# Patient Record
Sex: Female | Born: 1950 | Race: Black or African American | Hispanic: No | Smoking: Never smoker
Health system: Southern US, Community
[De-identification: ages and names within clinical notes are randomized; demographics above are authoritative.]

## PROBLEM LIST (undated history)

## (undated) DIAGNOSIS — R413 Other amnesia: Secondary | ICD-10-CM

## (undated) DIAGNOSIS — D649 Anemia, unspecified: Secondary | ICD-10-CM

## (undated) DIAGNOSIS — Z853 Personal history of malignant neoplasm of breast: Secondary | ICD-10-CM

## (undated) DIAGNOSIS — E785 Hyperlipidemia, unspecified: Secondary | ICD-10-CM

## (undated) DIAGNOSIS — Z95828 Presence of other vascular implants and grafts: Secondary | ICD-10-CM

## (undated) DIAGNOSIS — R0609 Other forms of dyspnea: Secondary | ICD-10-CM

## (undated) DIAGNOSIS — G459 Transient cerebral ischemic attack, unspecified: Secondary | ICD-10-CM

## (undated) DIAGNOSIS — I2699 Other pulmonary embolism without acute cor pulmonale: Secondary | ICD-10-CM

## (undated) DIAGNOSIS — K449 Diaphragmatic hernia without obstruction or gangrene: Secondary | ICD-10-CM

## (undated) DIAGNOSIS — R42 Dizziness and giddiness: Secondary | ICD-10-CM

## (undated) DIAGNOSIS — I82409 Acute embolism and thrombosis of unspecified deep veins of unspecified lower extremity: Secondary | ICD-10-CM

## (undated) DIAGNOSIS — I639 Cerebral infarction, unspecified: Secondary | ICD-10-CM

## (undated) DIAGNOSIS — I10 Essential (primary) hypertension: Secondary | ICD-10-CM

## (undated) DIAGNOSIS — I5030 Unspecified diastolic (congestive) heart failure: Secondary | ICD-10-CM

## (undated) DIAGNOSIS — M1990 Unspecified osteoarthritis, unspecified site: Secondary | ICD-10-CM

## (undated) DIAGNOSIS — M47812 Spondylosis without myelopathy or radiculopathy, cervical region: Secondary | ICD-10-CM

## (undated) DIAGNOSIS — C50919 Malignant neoplasm of unspecified site of unspecified female breast: Secondary | ICD-10-CM

## (undated) DIAGNOSIS — G4700 Insomnia, unspecified: Secondary | ICD-10-CM

## (undated) DIAGNOSIS — K219 Gastro-esophageal reflux disease without esophagitis: Secondary | ICD-10-CM

## (undated) DIAGNOSIS — F32A Depression, unspecified: Secondary | ICD-10-CM

## (undated) DIAGNOSIS — F419 Anxiety disorder, unspecified: Secondary | ICD-10-CM

## (undated) DIAGNOSIS — Z7901 Long term (current) use of anticoagulants: Secondary | ICD-10-CM

## (undated) DIAGNOSIS — I2510 Atherosclerotic heart disease of native coronary artery without angina pectoris: Secondary | ICD-10-CM

## (undated) DIAGNOSIS — C801 Malignant (primary) neoplasm, unspecified: Secondary | ICD-10-CM

## (undated) DIAGNOSIS — Z550 Illiteracy and low-level literacy: Secondary | ICD-10-CM

## (undated) DIAGNOSIS — M5136 Other intervertebral disc degeneration, lumbar region: Secondary | ICD-10-CM

## (undated) DIAGNOSIS — M51369 Other intervertebral disc degeneration, lumbar region without mention of lumbar back pain or lower extremity pain: Secondary | ICD-10-CM

## (undated) DIAGNOSIS — R519 Headache, unspecified: Secondary | ICD-10-CM

## (undated) DIAGNOSIS — R0600 Dyspnea, unspecified: Secondary | ICD-10-CM

## (undated) DIAGNOSIS — R011 Cardiac murmur, unspecified: Secondary | ICD-10-CM

## (undated) DIAGNOSIS — R002 Palpitations: Secondary | ICD-10-CM

## (undated) HISTORY — DX: Palpitations: R00.2

## (undated) HISTORY — DX: Insomnia, unspecified: G47.00

## (undated) HISTORY — DX: Unspecified diastolic (congestive) heart failure: I50.30

## (undated) HISTORY — DX: Transient cerebral ischemic attack, unspecified: G45.9

## (undated) HISTORY — DX: Cardiac murmur, unspecified: R01.1

## (undated) HISTORY — DX: Anxiety disorder, unspecified: F41.9

## (undated) HISTORY — DX: Long term (current) use of anticoagulants: Z79.01

## (undated) HISTORY — DX: Illiteracy and low-level literacy: Z55.0

## (undated) HISTORY — DX: Other intervertebral disc degeneration, lumbar region without mention of lumbar back pain or lower extremity pain: M51.369

## (undated) HISTORY — DX: Acute embolism and thrombosis of unspecified deep veins of unspecified lower extremity: I82.409

## (undated) HISTORY — PX: TOTAL HIP ARTHROPLASTY: SHX124

## (undated) HISTORY — DX: Other amnesia: R41.3

## (undated) HISTORY — DX: Spondylosis without myelopathy or radiculopathy, cervical region: M47.812

## (undated) HISTORY — PX: PORTA CATH INSERTION: CATH118285

## (undated) HISTORY — DX: Cerebral infarction, unspecified: I63.9

## (undated) HISTORY — PX: TOTAL KNEE ARTHROPLASTY: SHX125

## (undated) HISTORY — DX: Personal history of malignant neoplasm of breast: Z85.3

## (undated) HISTORY — DX: Other intervertebral disc degeneration, lumbar region: M51.36

## (undated) HISTORY — PX: ABDOMINAL HYSTERECTOMY: SHX81

---

## 2005-06-02 ENCOUNTER — Encounter: Payer: Self-pay | Admitting: Orthopedic Surgery

## 2005-06-09 ENCOUNTER — Encounter: Payer: Self-pay | Admitting: Orthopedic Surgery

## 2005-07-09 ENCOUNTER — Encounter: Payer: Self-pay | Admitting: Orthopedic Surgery

## 2008-03-08 ENCOUNTER — Emergency Department (HOSPITAL_COMMUNITY): Admission: EM | Admit: 2008-03-08 | Discharge: 2008-03-08 | Payer: Self-pay | Admitting: Emergency Medicine

## 2008-07-21 ENCOUNTER — Observation Stay (HOSPITAL_COMMUNITY): Admission: EM | Admit: 2008-07-21 | Discharge: 2008-07-23 | Payer: Self-pay | Admitting: Emergency Medicine

## 2008-07-21 ENCOUNTER — Ambulatory Visit: Payer: Self-pay | Admitting: Cardiology

## 2008-07-22 ENCOUNTER — Encounter: Payer: Self-pay | Admitting: Cardiology

## 2008-07-23 ENCOUNTER — Emergency Department (HOSPITAL_COMMUNITY): Admission: EM | Admit: 2008-07-23 | Discharge: 2008-07-23 | Payer: Self-pay | Admitting: Emergency Medicine

## 2009-11-24 ENCOUNTER — Emergency Department (HOSPITAL_COMMUNITY): Admission: EM | Admit: 2009-11-24 | Discharge: 2009-11-24 | Payer: Self-pay | Admitting: Emergency Medicine

## 2010-04-03 ENCOUNTER — Observation Stay (HOSPITAL_COMMUNITY): Admission: EM | Admit: 2010-04-03 | Discharge: 2010-04-04 | Payer: Self-pay | Admitting: Emergency Medicine

## 2010-12-25 LAB — CBC
HCT: 36.7 % (ref 36.0–46.0)
MCH: 25.1 pg — ABNORMAL LOW (ref 26.0–34.0)
MCHC: 32.8 g/dL (ref 30.0–36.0)
MCHC: 33 g/dL (ref 30.0–36.0)
MCV: 76.2 fL — ABNORMAL LOW (ref 78.0–100.0)
Platelets: 272 10*3/uL (ref 150–400)
Platelets: 294 10*3/uL (ref 150–400)
RBC: 4.79 MIL/uL (ref 3.87–5.11)
RDW: 14.9 % (ref 11.5–15.5)
WBC: 6.5 10*3/uL (ref 4.0–10.5)
WBC: 8.4 10*3/uL (ref 4.0–10.5)

## 2010-12-25 LAB — URINALYSIS, ROUTINE W REFLEX MICROSCOPIC
Glucose, UA: NEGATIVE mg/dL
Protein, ur: NEGATIVE mg/dL
Urobilinogen, UA: 0.2 mg/dL (ref 0.0–1.0)

## 2010-12-25 LAB — BASIC METABOLIC PANEL
BUN: 10 mg/dL (ref 6–23)
CO2: 25 mEq/L (ref 19–32)
Calcium: 9 mg/dL (ref 8.4–10.5)
Chloride: 106 mEq/L (ref 96–112)
GFR calc non Af Amer: >60 mL/min (ref >60)
Glucose, Bld: 109 mg/dL — ABNORMAL HIGH (ref 70–99)
Potassium: 3.7 mEq/L (ref 3.5–5.1)
Sodium: 139 mEq/L (ref 135–145)

## 2010-12-25 LAB — POCT CARDIAC MARKERS
CKMB, poc: 1.5 ng/mL (ref 1.0–8.0)
Troponin i, poc: <0.05 ng/mL (ref 0.00–0.09)

## 2010-12-25 LAB — DIFFERENTIAL
Basophils Absolute: 0 10*3/uL (ref 0.0–0.1)
Basophils Relative: 1 % (ref 0–1)
Eosinophils Absolute: 0.1 10*3/uL (ref 0.0–0.7)
Eosinophils Relative: 2 % (ref 0–5)
Lymphocytes Relative: 29 % (ref 12–46)
Lymphs Abs: 2.7 10*3/uL (ref 0.7–4.0)
Monocytes Absolute: 0.6 10*3/uL (ref 0.1–1.0)
Monocytes Relative: 10 % (ref 3–12)
Neutro Abs: 3.8 10*3/uL (ref 1.7–7.7)
Neutrophils Relative %: 56 % (ref 43–77)

## 2010-12-25 LAB — CARDIAC PANEL(CRET KIN+CKTOT+MB+TROPI)
CK, MB: 1.3 ng/mL (ref 0.3–4.0)
Relative Index: 0.9 (ref 0.0–2.5)
Relative Index: 1.2 (ref 0.0–2.5)
Total CK: 142 U/L (ref 7–177)
Troponin I: 0.01 ng/mL (ref 0.00–0.06)
Troponin I: 0.02 ng/mL (ref 0.00–0.06)

## 2010-12-25 LAB — URINE MICROSCOPIC-ADD ON

## 2011-02-21 NOTE — Consult Note (Signed)
NAME:  Margaret Avila, Margaret Avila NO.:  [id number]   MEDICAL RECORD NO.:  [id number]          PATIENT TYPE:  INP   LOCATION:  A321                          FACILITY:  [facility]   PHYSICIAN:  Jonelle Sidle, MD DATE OF BIRTH:  10/11/1950   DATE OF CONSULTATION:  DATE OF DISCHARGE:                                 CONSULTATION   REFERRING PHYSICIAN:  [facility].   PRIMARY CARE PHYSICIAN:  Dr. Jorene Guest with [facility].   REASON FOR CONSULTATION:  Chest pain.   HISTORY OF PRESENT ILLNESS:  Margaret Avila is a pleasant 60 year old  woman with a history of hypertension at least over the last 3-4 years by  her report but no personal history of cardiovascular disease or  myocardial infarction.  She has had problems with gastroesophageal  reflux disease and has undergone prior endoscopy, presently being  treated with a proton-pump-inhibitor.  She is now admitted to the  hospital with a fairly longstanding intermittent history of sharp  stabbing chest pain that is very sporadic and not reproduced with  exertion.  Symptoms can last for several hours at a time and there is no  specific alleviating factor noted.  Margaret Avila states that these  symptoms have been going on for the last year although has been  progressing in both frequency and intensity.  She states that she saw  Dr. Willene Hatchet with [facility] at the [facility] (he is a cardiologist from [facility]) back on  October 8.  There are no details available at this time, but she tells  me that other testing was planned.   Margaret Avila says that she may have had some type of stress testing  approximately 2 years ago in [place], [place] and thinks that these  results may have been reassuring.  At the present time, she denies any  active chest pain and her cardiac markers have actually been fairly  reassuring.  Peak troponin-I level at this point is 0.02 and  her peak CK-  MB is 1.6.  BNP level is less than 30.  Electrocardiogram demonstrates  sinus rhythm with decreased R-wave progression, but no frank  anteroseptal Q-waves and nonspecific T-wave changes noted.  No old  tracings are available for comparison at this point.  Her chest x-ray  shows cardiomegaly with no acute abnormalities, specifically no  effusions or infiltrates and no pneumothorax.   ALLERGIES:  No known drug allergies.   MEDICATIONS AT HOME:  1. Amlodipine 10 mg p.o. daily.  2. Carvedilol 25 mg p.o. b.i.d.  3. Omeprazole 20 mg p.o. daily.  4. Meclizine 12.5 mg p.o. t.i.d.  5. Clonidine 0.1 mg p.o. b.i.d.   At the present time she is also being treated with:  1. Aspirin 81 mg p.o. daily.  2. Heparin infusion.   PAST MEDICAL HISTORY:  As detailed above.  There may also a history of  hyperlipidemia being managed conservatively based on limited  information.  She also has a history of vertigo.   PAST SURGICAL HISTORY:  1. Hysterectomy.  2. Previous  knee surgery.   SOCIAL HISTORY:  Margaret Avila is unemployed at this time.  She denies  any history of tobacco, alcohol or illicit substance use.  She lives in  [place] and states that she typically functions independently.   FAMILY HISTORY:  Significant for high blood pressure and some type of  aneurysm.  There is no clear history of premature cardiovascular  disease.  She does state her father and mother both had heart problems,  but that they died at a relatively advanced age.   REVIEW OF SYSTEMS:  No fevers or chills.  No cough, hemoptysis, melena,  hematochezia, palpitations, orthopnea, PND, lower extremity edema.  She  denies any significant claudication symptoms.  She does have reflux.  Some general weakness.  Otherwise, negative.   PHYSICAL EXAMINATION:  Temperature is 98.4 degrees, heart rate is 76 in  sinus rhythm, respirations 20, blood pressure 150/82, oxygen saturation  91% on 2 L nasal cannula,  previously 100%, weight is 84.7 kg, height 63  inches.  This is an overweight woman lying in bed in no acute distress, denying  any active chest pain or breathlessness.  HEENT:  Conjunctivae is normal.  Oropharynx is clear.  Poor dentition.  NECK:  Supple.  No elevated jugular venous pressure.  No loud carotid  bruits or thyromegaly.  LUNGS:  Clear diminished breath sounds, but nonlabored breathing.  CARDIAC:  Exam reveals a regular rate and rhythm.  No significant  systolic murmur or pericardial rub.  No obvious S3 gallop.  Heart sounds  somewhat distant.  ABDOMEN:  Soft, nontender.  No obvious hepatomegaly.  No  guarding.  Bowel sounds are present.  EXTREMITIES:  Exhibit no frank pitting edema.  Distal pulses are 1-2+.  SKIN:  Warm and dry.  MUSCULOSKELETAL:  No kyphosis noted.  NEUROPSYCHIATRIC:  The patient is alert x3.  Affect is somewhat flat   LABORATORY DATA:  WBC 6.9, hemoglobin 12.8, hematocrit 38.2, platelets  275, INR 1.1.  Sodium 139, potassium 4.1, chloride 105, bicarb 27,  glucose 95, BUN 16, creatinine 0.74 lipase 21, alkaline phosphatase 105,  AST 15, ALT 15, albumin 3.7.   IMPRESSION:  1. Chest pain syndrome with fairly atypical features.  Symptoms have      been intermittent yet progressive and in sporadic fashion over the      last year.  At this point, resting electrocardiogram is rather      nonspecific with decreased anterior R-wave progression, but no      frank Q-waves.  Cardiac markers are reassuring.  She has had      assessment by Dr. Jaymes Graff just recently with plans for some type      of additional testing, I presume further risk stratification      although no details are available at this time.  She is presently      symptom free and hemodynamically stable.  2. History of hypertension.  3. Possible history of hyperlipidemia.  4. Gastroesophageal reflux disease with previous endoscopy and      treatment with proton- pump-inhibitor therapy.    RECOMMENDATIONS:  I discussed the situation with the patient.  At this  point, we will plan to obtain records from her recent evaluation by Dr.  Jaymes Graff.  Would continue to cycle full set of cardiac markers and we  will arrange a 2-D echocardiogram as well to exclude any obvious focal  wall motion abnormalities that might help guide further ischemic  testing.  Tentatively, an inpatient adenosine Myoview  will be scheduled  for tomorrow morning (the patient has already eaten today) unless  decision is made to proceed with further invasive testing based on  review of records and her echocardiogram.  Her symptoms are fairly  atypical and at this point our plan will be a noninvasive assessment.  We will continue present medical therapy and we will follow up on her  later.      Jonelle Sidle, MD  Electronically Signed     SGM/MEDQ  D:  07/22/2008  T:  07/22/2008  Job:  [id number]   cc:   Jorene Guest, Dr.  [facility]   [facility]

## 2011-02-21 NOTE — Group Therapy Note (Signed)
Margaret Avila, Margaret Avila              ACCOUNT NO.:  [id number]   MEDICAL RECORD NO.:  [id number]          PATIENT TYPE:  INP   LOCATION:  A321                          FACILITY:  [facility]   PHYSICIAN:  Dorris Singh, DO    DATE OF BIRTH:  21-Dec-1950   DATE OF PROCEDURE:  07/22/2008  DATE OF DISCHARGE:                                 PROGRESS NOTE   The patient is seen today, has denied any chest pain.  She just finished  an echo.  States she feels pretty good and has not had any repeat  episode since admission.  She has been seen by cardiology and awaits  further instructions regarding discharge planning or additional testing.   VITAL SIGNS:  Temperature is 98.4, pulse 76, respirations 20, blood  pressure 150/82.  GENERAL:  The patient is an [place] American female who is well-  developed, well-nourished, in no acute distress.  HEART:  Regular rate and rhythm.  LUNGS:  Clear to auscultation bilaterally.  ABDOMEN:  Soft, nontender, nondistended.  EXTREMITIES:  Positive pulses, no ecchymosis, edema or cyanosis.   She has no labs for today except for cardiac markers which are all  within normal limits.   ASSESSMENT AND PLAN:  1. Chest pain.  Echo was done.  We have gotten [facility] on      the case and they will do an inpatient adenosine Myoview scheduled      for tomorrow and will continue to monitor her throughout the night.  2. Hypertension.  Continue her antihypertensives.  3. Vertigo.  She is on meclizine.  Will continue with that.  4. History of dyslipidemia.  Will continue her with her current      medication regimen.      Dorris Singh, DO  Electronically Signed     CB/MEDQ  D:  07/22/2008  T:  07/22/2008  Job:  [id number]

## 2011-02-21 NOTE — H&P (Signed)
NAMECORTANA, VANDERFORD NO.:  [id number]   MEDICAL RECORD NO.:  [id number]          PATIENT TYPE:  EMS   LOCATION:  ED                            FACILITY:  [facility]   PHYSICIAN:  Osvaldo Shipper, MD     DATE OF BIRTH:  11/15/1950   DATE OF ADMISSION:  07/21/2008  DATE OF DISCHARGE:  [facility]                              HISTORY & PHYSICAL   PRIMARY CARE PHYSICIAN:  Dr. Jorene Guest with [facility].   ADMITTING DIAGNOSES:  1. Chest pain, rule out acute coronary syndrome.  2. Hypertension.  3. Obesity.  4. History of vertigo.   CHIEF COMPLAINT:  Chest pain since yesterday morning.   HISTORY OF PRESENT ILLNESS:  The patient is a 60 year old African  American female who it appears has had longstanding history of chest  pain.  Apparently she underwent an EGD for this about a year ago done in  [place].  She was started on omeprazole after that.  She apparently has  been having chest pain for the past few months and has been referred to  Dr. Mikki Harbor from [facility] in [place].  She had an  appointment with him on October 8 and they were supposed to call her  back for further testing in their office but she has not received that  call yet.  The patient was in her usual state of health until yesterday  morning when she was sitting and suddenly experienced retrosternal chest  pain.  The pain was described as a pressure like sensation, stabbing  pains which persisted throughout yesterday, through the night and this  morning.  She experienced shortness of breath.  She experienced an  episode of emesis on Sunday night.  She denies any palpitations, no  diaphoresis, no aggravating or alleviating factors identified though she  does state that the pain appears to be worse when she is lying down than  sitting up.  No fever or chills.  No cough.  No sick contacts.  No long  road trips or any other travel outside this area.  The pain initially  was 8/10 in  intensity and eventually was given nitroglycerin and it came  down to 0/10 in intensity.   Please note that it is unclear after history was obtained exactly when  the pain started because the first time she told me it was Monday  morning and the other time she told me it was Sunday evening so history  is not of good quality from this patient.   HOME MEDICATIONS:  1. Amlodipine 10 mg once a day.  2. Carvedilol 25 mg twice a day.  3. Omeprazole 40 mg once a day.  4. Meclizine 12.5 mg 3 times a day.  5. Clonidine 0.1 mg twice a day.   ALLERGIES:  No known drug allergies.   PAST MEDICAL HISTORY:  Consists of hypertension, obesity, GERD, says  dyslipidemia but she is not on any medications.  She also has history of  vertigo for which she is on meclizine.   PAST SURGICAL HISTORY:  Includes hysterectomy, knee surgery and  she has  had that EGD about a year ago.   SOCIAL HISTORY:  Lives in [place].  Unemployed.  No smoking,  alcohol or illicit drug use.  Independent for daily activities.   FAMILY HISTORY:  Positive for hypertension and aneurysm of unknown type.  No history of coronary artery disease that the patient is aware of.   REVIEW OF SYSTEMS:  GENERAL:  Positive for weakness, malaise.  HEENT:  Normocephalic.  CARDIOVASCULAR:  As in HPI.  RESPIRATORY:  As in HPI.  GI:  Unremarkable.  GU:  Unremarkable.  NEUROLOGICAL:  Unremarkable.  PSYCHIATRIC:  Unremarkable.  MUSCULOSKELETAL:  Unremarkable.   PHYSICAL EXAMINATION:  VITAL SIGNS:  Temperature 98.2, blood pressure  143/75, heart rate 102, regular.  Respiratory rate 16, saturation 100%  on room air.  GENERAL:  An obese black female in no distress.  HEENT:  There is no pallor, no icterus.  Oral mucosa membranes are  moist.  No oral lesions are noted.  NECK:  Soft and supple. No thyromegaly is appreciated.  LUNGS:  Clear to auscultation bilaterally.  No wheezing, rales or  rhonchi.  CARDIOVASCULAR:  S1, S2 normal.   Regular.  No murmurs appreciated.  No  S3, S4, no rubs, no bruits.  No JVD is noted.  ABDOMEN:  Obese, nontender, nondistended.  Bowel sounds are present.  No  masses or organomegaly is appreciated.  EXTREMITIES:  Do not show any edema.  MUSCULOSKELETAL:  Otherwise unremarkable.  NEUROLOGICAL:  She is alert and oriented x3.  No focal neurological  deficits are present.   LAB DATA:  CBC is unremarkable.  PT, INR and PTT normal.  BMET is  normal.  Cardiac markers are normal.   She had a chest x-ray which did not show any acute cardiopulmonary  process.  Cardiomegaly however was noticed.  EKG shows a sinus rhythm  with normal axis.  Intervals appeared to be in the normal range.  No Q  waves are identified.  No concerning ST or T wave changes are identified  on this EKG.  No old EKGs available to compare.   ASSESSMENT:  This is a 60 year old black female who presents with chest  pain.  She has risk factors in the form of hypertension, obesity.  The  pain has been ongoing for at least a few months on and off.  She has  seen a cardiologist from what appears to be [facility] cardiology department  but has not undergone any testing yet.  Differential diagnosis include  coronary artery disease, esophageal spasms, GERD.  PE is less likely  considering the duration of the pain.   PLAN:  1. Chest pain, admit her to the hospital and rule her out for acute      coronary syndrome.  She has been started on heparin and      nitroglycerin dose which will be continued.  We will repeat EKGs.      We will consult cardiology from [place] to help [place] evaluate this      patient further. LFTs and lipase will also be checked.  2. Hypertension.  Will continue with her current antihypertensive      regimen.  3. History of vertigo.  Will continue with meclizine.  4. History of dyslipidemia.  Will check her lipid profile and if she      requires will start her on a statin.   Further management/decisions will depend  on results of further testing  and the patient's response to treatment.  Osvaldo Shipper, MD  Electronically Signed     GK/MEDQ  D:  07/21/2008  T:  07/21/2008  Job:  [id number]   cc:   Gerrit Friends. Dietrich Pates, MD, [facility]  [address]  [place], [place] [address]

## 2011-02-24 NOTE — Discharge Summary (Signed)
Margaret Avila, Margaret Avila              ACCOUNT NO.:  [id number]   MEDICAL RECORD NO.:  [id number]          PATIENT TYPE:  OBV   LOCATION:  A321                          FACILITY:  [facility]   PHYSICIAN:  Dorris Singh, DO    DATE OF BIRTH:  1950-11-15   DATE OF ADMISSION:  07/21/2008  DATE OF DISCHARGE:  10/15/2009[facility]                               DISCHARGE SUMMARY   ADMISSION DIAGNOSES:  1. Chest pain.  Rule out acute coronary syndrome.  2. Hypertension.  3. Obesity.  4. History of vertigo.   DISCHARGE DIAGNOSES:  1. Chest pain, resolved.  2. History of hypertension.  3. History of obesity.  4. History of vertigo.   PRIMARY CARE PHYSICIAN:  Dr. Jorene Guest.   TESTING THAT SHE HAD DONE:  She had a portable chest x-ray on July 21, 2008 which showed cardiomegaly, no acute abnormalities, and then on  July 23, 2008 she had a Myoview stress test which showed negative  stress nuclear myocardial study revealing somewhat impaired exercise  capacity, normal left ventricular size, normal left systolic function,  and normal left myocardial perfusion.  Other findings as noted.  Her H  and P was done by Dr. Rito Ehrlich.  The patient was admitted.   HOSPITAL COURSE:  She was admitted with the above diagnoses and she was  admitted to the service of [facility].  [facility] was consulted.  Due to the patient's waxing and waning symptoms, they obtained her old  records from Dr. Jaymes Graff and they continued to monitor her cardiac  markers as well as a 2D echo, and her 2D echo demonstrated normal EF.  Aortic thickness was mildly increased.  Left atrium was mildly dilated.  Right ventricular size was at its upper limits of normal.  On July 22, 2008 it was determined that due to the patient's waxing and waning  symptoms that they would go ahead with a tentatively-scheduled inpatient  adenosine Myoview, which was scheduled for the next day, and if this was  normal, then she could be discharged  with followup with her primary care  physician and with [facility] if needed.  On July 23, 2008  she had this test as noted above.  It was determined via cardiology that  the patient could then be discharged.   Her medications that she was discharged on included amlodipine 10 mg  once a day, carvedilol 25 mg twice a day, omeprazole 40 mg once a day,  meclizine 12.5 mg 3 times a day p.r.n., and clonidine 0.1 mg twice a  day.  She was not placed on any new medications.  She was recommended to  follow up with Dr. Jorene Guest in the next 1-3 days, as  soon as the next business day as possible.  She was told to increase her  activity slowly and to continue with a heart-healthy diet.  She was also  told to return if symptoms worsen.  All questions were answered for the  patient and I spent 30 minutes preparing this report.      Dorris Singh, DO  Electronically Signed  CB/MEDQ  D:  07/25/2008  T:  07/25/2008  Job:  [id number]

## 2011-07-05 LAB — BASIC METABOLIC PANEL
BUN: 22
CO2: 28
Creatinine, Ser: 0.84
GFR calc non Af Amer: >60
Glucose, Bld: 104 — ABNORMAL HIGH
Potassium: 3.3 — ABNORMAL LOW

## 2011-07-10 LAB — TSH: TSH: 1.422

## 2011-07-10 LAB — DIFFERENTIAL
Basophils Absolute: 0
Basophils Relative: 0
Basophils Relative: 1
Eosinophils Absolute: 0.1
Eosinophils Absolute: 0.2
Eosinophils Relative: 2
Lymphocytes Relative: 30
Lymphs Abs: 2.1
Monocytes Absolute: 0.8
Monocytes Absolute: 0.9
Monocytes Relative: 12
Monocytes Relative: 13 — ABNORMAL HIGH
Neutro Abs: 3.8
Neutrophils Relative %: 41 — ABNORMAL LOW
Neutrophils Relative %: 56

## 2011-07-10 LAB — B-NATRIURETIC PEPTIDE (CONVERTED LAB): Pro B Natriuretic peptide (BNP): <30

## 2011-07-10 LAB — BASIC METABOLIC PANEL
BUN: 16
CO2: 25
Calcium: 9.5
Chloride: 105
Creatinine, Ser: 0.74
Creatinine, Ser: 0.77
GFR calc Af Amer: >60
GFR calc Af Amer: >60
Glucose, Bld: 95
Sodium: 139

## 2011-07-10 LAB — CBC
Hemoglobin: 12
MCHC: 32.8
MCV: 79.9
MCV: 80.6
RBC: 4.54
RBC: 4.78
WBC: 6.9

## 2011-07-10 LAB — HEPATIC FUNCTION PANEL
ALT: 15
AST: 15
Albumin: 3.7
Alkaline Phosphatase: 105
Total Bilirubin: 0.4

## 2011-07-10 LAB — LIPID PANEL
LDL Cholesterol: 175 — ABNORMAL HIGH
Total CHOL/HDL Ratio: 5.4
VLDL: 24

## 2011-07-10 LAB — CARDIAC PANEL(CRET KIN+CKTOT+MB+TROPI)
CK, MB: 1.3
Relative Index: INVALID
Relative Index: INVALID
Troponin I: 0.02

## 2011-07-10 LAB — PROTIME-INR
INR: 1.1
Prothrombin Time: 14.3

## 2011-07-10 LAB — POCT CARDIAC MARKERS
CKMB, poc: 1.8
Myoglobin, poc: 55.4
Troponin i, poc: <0.05

## 2011-07-10 LAB — HEPARIN LEVEL (UNFRACTIONATED): Heparin Unfractionated: 1.2 — ABNORMAL HIGH

## 2011-07-10 LAB — APTT: aPTT: 29

## 2012-08-18 ENCOUNTER — Encounter (HOSPITAL_COMMUNITY): Payer: Self-pay | Admitting: Emergency Medicine

## 2012-08-18 ENCOUNTER — Emergency Department (HOSPITAL_COMMUNITY)
Admission: EM | Admit: 2012-08-18 | Discharge: 2012-08-18 | Disposition: A | Discharge status: Home / Self Care | Payer: Medicaid Other | Attending: Emergency Medicine | Admitting: Emergency Medicine

## 2012-08-18 ENCOUNTER — Emergency Department (HOSPITAL_COMMUNITY): Payer: Medicaid Other

## 2012-08-18 DIAGNOSIS — I10 Essential (primary) hypertension: Secondary | ICD-10-CM | POA: Insufficient documentation

## 2012-08-18 DIAGNOSIS — E785 Hyperlipidemia, unspecified: Secondary | ICD-10-CM | POA: Insufficient documentation

## 2012-08-18 DIAGNOSIS — Z79899 Other long term (current) drug therapy: Secondary | ICD-10-CM | POA: Insufficient documentation

## 2012-08-18 DIAGNOSIS — R42 Dizziness and giddiness: Secondary | ICD-10-CM | POA: Insufficient documentation

## 2012-08-18 DIAGNOSIS — Z8719 Personal history of other diseases of the digestive system: Secondary | ICD-10-CM | POA: Insufficient documentation

## 2012-08-18 DIAGNOSIS — Z90710 Acquired absence of both cervix and uterus: Secondary | ICD-10-CM | POA: Insufficient documentation

## 2012-08-18 DIAGNOSIS — R109 Unspecified abdominal pain: Secondary | ICD-10-CM | POA: Insufficient documentation

## 2012-08-18 HISTORY — DX: Hyperlipidemia, unspecified: E78.5

## 2012-08-18 HISTORY — DX: Gastro-esophageal reflux disease without esophagitis: K21.9

## 2012-08-18 HISTORY — DX: Essential (primary) hypertension: I10

## 2012-08-18 HISTORY — DX: Diaphragmatic hernia without obstruction or gangrene: K44.9

## 2012-08-18 HISTORY — DX: Dizziness and giddiness: R42

## 2012-08-18 LAB — CBC WITH DIFFERENTIAL/PLATELET
Basophils Absolute: 0 10*3/uL (ref 0.0–0.1)
Eosinophils Absolute: 0.1 10*3/uL (ref 0.0–0.7)
Eosinophils Relative: 1 % (ref 0–5)
MCH: 25.5 pg — ABNORMAL LOW (ref 26.0–34.0)
MCHC: 32.1 g/dL (ref 30.0–36.0)
MCV: 79.4 fL (ref 78.0–100.0)
Platelets: 318 10*3/uL (ref 150–400)
RDW: 13.9 % (ref 11.5–15.5)

## 2012-08-18 LAB — URINALYSIS, ROUTINE W REFLEX MICROSCOPIC
Glucose, UA: NEGATIVE mg/dL
Nitrite: NEGATIVE
Protein, ur: NEGATIVE mg/dL

## 2012-08-18 LAB — TROPONIN I: Troponin I: <0.3 ng/mL (ref <0.30)

## 2012-08-18 LAB — COMPREHENSIVE METABOLIC PANEL
ALT: 14 U/L (ref 0–35)
AST: 18 U/L (ref 0–37)
Calcium: 9.9 mg/dL (ref 8.4–10.5)
GFR calc Af Amer: 68 mL/min — ABNORMAL LOW (ref >90)
Sodium: 140 mEq/L (ref 135–145)
Total Protein: 8.1 g/dL (ref 6.0–8.3)

## 2012-08-18 MED ORDER — GI COCKTAIL ~~LOC~~
30.0000 mL | Freq: Once | ORAL | Status: AC
  Administered 2012-08-18: 30 mL via ORAL
  Filled 2012-08-18: qty 30

## 2012-08-18 MED ORDER — POTASSIUM CHLORIDE 20 MEQ/15ML (10%) PO LIQD
40.0000 meq | Freq: Once | ORAL | Status: AC
  Administered 2012-08-18: 40 meq via ORAL
  Filled 2012-08-18: qty 30

## 2012-08-18 MED ORDER — MORPHINE SULFATE 4 MG/ML IJ SOLN
4.0000 mg | INTRAMUSCULAR | Status: DC | PRN
  Administered 2012-08-18: 4 mg via INTRAVENOUS
  Filled 2012-08-18 (×2): qty 1

## 2012-08-18 MED ORDER — FAMOTIDINE IN NACL 20-0.9 MG/50ML-% IV SOLN
20.0000 mg | Freq: Once | INTRAVENOUS | Status: AC
  Administered 2012-08-18: 20 mg via INTRAVENOUS
  Filled 2012-08-18: qty 50

## 2012-08-18 NOTE — ED Notes (Signed)
Pt is aware of a urine sample, pt states she will notify staff when she can void.  

## 2012-08-18 NOTE — ED Provider Notes (Signed)
History     CSN: [id number]  Arrival date & time 08/18/12  8295   First MD Initiated Contact with Patient 08/18/12 1008      Chief Complaint  Patient presents with  . Gastrophageal Reflux     HPI Pt was seen at 1020.  Per pt, c/o gradual onset and persistence of constant upper mid-abd/lower mid-sternal chest "pain" since yesterday morning after she woke up.  Pt describes the pain as constant, "burning," and per her usual chronic GERD symptoms.  Denies palpitations, no SOB/cough, no back pain, no N/V/D, no fevers.    Past Medical History  Diagnosis Date  . Hypertension   . Hyperlipidemia   . Acid reflux   . Vertigo   . Hiatal hernia     Past Surgical History  Procedure Date  . Total knee arthroplasty   . Total hip arthroplasty   . Abdominal hysterectomy     History  Substance Use Topics  . Smoking status: Never Smoker   . Smokeless tobacco: Not on file  . Alcohol Use: No      Review of Systems ROS: Statement: All systems negative except as marked or noted in the HPI; Constitutional: Negative for fever and chills. ; ; Eyes: Negative for eye pain, redness and discharge. ; ; ENMT: Negative for ear pain, hoarseness, nasal congestion, sinus pressure and sore throat. ; ; Cardiovascular: Negative for chest pain, palpitations, diaphoresis, dyspnea and peripheral edema. ; ; Respiratory: Negative for cough, wheezing and stridor. ; ; Gastrointestinal: +abd pain. Negative for nausea, vomiting, diarrhea, blood in stool, hematemesis, jaundice and rectal bleeding. . ; ; Genitourinary: Negative for dysuria, flank pain and hematuria. ; ; Musculoskeletal: Negative for back pain and neck pain. Negative for swelling and trauma.; ; Skin: Negative for pruritus, rash, abrasions, blisters, bruising and skin lesion.; ; Neuro: Negative for headache, lightheadedness and neck stiffness. Negative for weakness, altered level of consciousness , altered mental status, extremity weakness, paresthesias,  involuntary movement, seizure and syncope.        Allergies  Review of patient's allergies indicates no known allergies.  Home Medications   Current Outpatient Rx  Name  Route  Sig  Dispense  Refill  . AMLODIPINE BESYLATE 10 MG PO TABS   Oral   Take 10 mg by mouth daily.         Marland Kitchen HYDROCHLOROTHIAZIDE 25 MG PO TABS   Oral   Take 25 mg by mouth daily.         Marland Kitchen OMEPRAZOLE 20 MG PO CPDR   Oral   Take 20 mg by mouth daily.         Marland Kitchen PRAVASTATIN SODIUM 40 MG PO TABS   Oral   Take 40 mg by mouth daily.           BP 139/81  Pulse 83  Temp 98.6 F (37 C) (Oral)  Resp 19  Ht 5\' 3"  (1.6 m)  Wt 186 lb (84.369 kg)  BMI 32.95 kg/m2  SpO2 98%  Physical Exam 1025: Physical examination:  Nursing notes reviewed; Vital signs and O2 SAT reviewed;  Constitutional: Well developed, Well nourished, Well hydrated, In no acute distress; Head:  Normocephalic, atraumatic; Eyes: EOMI, PERRL, No scleral icterus; ENMT: Mouth and pharynx normal, Mucous membranes moist; Neck: Supple, Full range of motion, No lymphadenopathy; Cardiovascular: Regular rate and rhythm, No murmur, rub, or gallop; Respiratory: Breath sounds clear & equal bilaterally, No rales, rhonchi, wheezes.  Speaking full sentences with ease, Normal respiratory effort/excursion;  Chest: Nontender, Movement normal; Abdomen: Soft, +mid-epigastric area tender to palp. No rebound or guarding. Nondistended, Normal bowel sounds;; Extremities: Pulses normal, No tenderness, No edema, No calf edema or asymmetry.; Neuro: AA&Ox3, Major CN grossly intact.  Speech clear. No gross focal motor or sensory deficits in extremities.; Skin: Color normal, Warm, Dry.   ED Course  Procedures   MDM  MDM Reviewed: previous chart, nursing note and vitals Reviewed previous: labs and ECG Interpretation: labs, ECG and x-ray    Date: 08/18/2012  Rate: 95  Rhythm: normal sinus rhythm  QRS Axis: normal  Intervals: normal  ST/T Wave abnormalities:  normal  Conduction Disutrbances:none  Narrative Interpretation:   Old EKG Reviewed: unchanged; no significant changes from previous EKG dated 04/03/2010.   Results for orders placed during the hospital encounter of 08/18/12  CBC WITH DIFFERENTIAL      Component Value Range   WBC 7.2  4.0 - 10.5 K/uL   RBC 4.94  3.87 - 5.11 MIL/uL   Hemoglobin 12.6  12.0 - 15.0 g/dL   HCT 16.1  09.6 - 04.5 %   MCV 79.4  78.0 - 100.0 fL   MCH 25.5 (*) 26.0 - 34.0 pg   MCHC 32.1  30.0 - 36.0 g/dL   RDW 40.9  81.1 - 91.4 %   Platelets 318  150 - 400 K/uL   Neutrophils Relative 60  43 - 77 %   Neutro Abs 4.3  1.7 - 7.7 K/uL   Lymphocytes Relative 30  12 - 46 %   Lymphs Abs 2.1  0.7 - 4.0 K/uL   Monocytes Relative 9  3 - 12 %   Monocytes Absolute 0.6  0.1 - 1.0 K/uL   Eosinophils Relative 1  0 - 5 %   Eosinophils Absolute 0.1  0.0 - 0.7 K/uL   Basophils Relative 0  0 - 1 %   Basophils Absolute 0.0  0.0 - 0.1 K/uL  COMPREHENSIVE METABOLIC PANEL      Component Value Range   Sodium 140  135 - 145 mEq/L   Potassium 3.2 (*) 3.5 - 5.1 mEq/L   Chloride 100  96 - 112 mEq/L   CO2 25  19 - 32 mEq/L   Glucose, Bld 101 (*) 70 - 99 mg/dL   BUN 16  6 - 23 mg/dL   Creatinine, Ser 7.82  0.50 - 1.10 mg/dL   Calcium 9.9  8.4 - 95.6 mg/dL   Total Protein 8.1  6.0 - 8.3 g/dL   Albumin 3.9  3.5 - 5.2 g/dL   AST 18  0 - 37 U/L   ALT 14  0 - 35 U/L   Alkaline Phosphatase 112  39 - 117 U/L   Total Bilirubin 0.2 (*) 0.3 - 1.2 mg/dL   GFR calc non Af Amer 59 (*) >90 mL/min   GFR calc Af Amer 68 (*) >90 mL/min  LIPASE, BLOOD      Component Value Range   Lipase 23  11 - 59 U/L  TROPONIN I      Component Value Range   Troponin I <0.30  <0.30 ng/mL  URINALYSIS, ROUTINE W REFLEX MICROSCOPIC      Component Value Range   Color, Urine YELLOW  YELLOW   APPearance CLEAR  CLEAR   Specific Gravity, Urine 1.020  1.005 - 1.030   pH 7.0  5.0 - 8.0   Glucose, UA NEGATIVE  NEGATIVE mg/dL   Hgb urine dipstick NEGATIVE   NEGATIVE  Bilirubin Urine NEGATIVE  NEGATIVE   Ketones, ur NEGATIVE  NEGATIVE mg/dL   Protein, ur NEGATIVE  NEGATIVE mg/dL   Urobilinogen, UA 0.2  0.0 - 1.0 mg/dL   Nitrite NEGATIVE  NEGATIVE   Leukocytes, UA TRACE (*) NEGATIVE  URINE MICROSCOPIC-ADD ON      Component Value Range   Squamous Epithelial / LPF FEW (*) RARE   WBC, UA 0-2  <3 WBC/hpf   Dg Chest 2 View 08/18/2012  *RADIOLOGY REPORT*  Clinical Data: Pain  CHEST - 2 VIEW  Comparison: 07/21/2008  Findings: Cardiomediastinal silhouette is stable.  Elevation of the right hemidiaphragm again noted.  No acute infiltrate or pulmonary edema.  Mild right basilar atelectasis.  Stable degenerative changes thoracic spine.  IMPRESSION: Elevation of the right hemidiaphragm again noted.  Mild right basilar atelectasis.  No acute infiltrate or pulmonary edema.   Original Report Authenticated By: Natasha Mead, M.D.      1515:  Feels improved after meds, wants to go home now.  Doubt ACS as cause for symptoms given constant symptoms for 24+ hours, as well as unchanged EKG and normal troponin.  Dx and testing d/w pt and family.  Questions answered.  Verb understanding, agreeable to d/c home with outpt f/u.          Laray Anger, DO 08/21/12 1324

## 2012-08-18 NOTE — ED Notes (Signed)
Pt states she has severe gastric reflux. Complain of burning and choking sensation that stated yesterday evening about 2:30

## 2012-08-19 LAB — URINE CULTURE: Colony Count: 30000

## 2015-10-24 ENCOUNTER — Emergency Department (HOSPITAL_COMMUNITY): Payer: Medicare Other

## 2015-10-24 ENCOUNTER — Inpatient Hospital Stay (HOSPITAL_COMMUNITY)
Admission: EM | Admit: 2015-10-24 | Discharge: 2015-10-25 | DRG: 176 | Disposition: A | Discharge status: Home / Self Care | Payer: Medicare Other | Attending: Internal Medicine | Admitting: Internal Medicine

## 2015-10-24 ENCOUNTER — Encounter (HOSPITAL_COMMUNITY): Payer: Self-pay | Admitting: Emergency Medicine

## 2015-10-24 DIAGNOSIS — Z23 Encounter for immunization: Secondary | ICD-10-CM | POA: Diagnosis not present

## 2015-10-24 DIAGNOSIS — Z96649 Presence of unspecified artificial hip joint: Secondary | ICD-10-CM | POA: Diagnosis present

## 2015-10-24 DIAGNOSIS — C50912 Malignant neoplasm of unspecified site of left female breast: Secondary | ICD-10-CM | POA: Diagnosis present

## 2015-10-24 DIAGNOSIS — I2699 Other pulmonary embolism without acute cor pulmonale: Secondary | ICD-10-CM | POA: Diagnosis present

## 2015-10-24 DIAGNOSIS — R079 Chest pain, unspecified: Secondary | ICD-10-CM | POA: Diagnosis present

## 2015-10-24 DIAGNOSIS — Z96659 Presence of unspecified artificial knee joint: Secondary | ICD-10-CM | POA: Diagnosis present

## 2015-10-24 DIAGNOSIS — C50919 Malignant neoplasm of unspecified site of unspecified female breast: Secondary | ICD-10-CM

## 2015-10-24 DIAGNOSIS — E785 Hyperlipidemia, unspecified: Secondary | ICD-10-CM | POA: Diagnosis present

## 2015-10-24 DIAGNOSIS — I10 Essential (primary) hypertension: Secondary | ICD-10-CM

## 2015-10-24 DIAGNOSIS — Z803 Family history of malignant neoplasm of breast: Secondary | ICD-10-CM | POA: Diagnosis not present

## 2015-10-24 DIAGNOSIS — Z7982 Long term (current) use of aspirin: Secondary | ICD-10-CM | POA: Diagnosis not present

## 2015-10-24 DIAGNOSIS — K219 Gastro-esophageal reflux disease without esophagitis: Secondary | ICD-10-CM | POA: Diagnosis present

## 2015-10-24 DIAGNOSIS — I82C11 Acute embolism and thrombosis of right internal jugular vein: Secondary | ICD-10-CM

## 2015-10-24 HISTORY — DX: Malignant neoplasm of unspecified site of unspecified female breast: C50.919

## 2015-10-24 HISTORY — DX: Malignant (primary) neoplasm, unspecified: C80.1

## 2015-10-24 LAB — CBC WITH DIFFERENTIAL/PLATELET
BASOS PCT: 0 %
Basophils Absolute: 0 10*3/uL (ref 0.0–0.1)
Eosinophils Absolute: 0 10*3/uL (ref 0.0–0.7)
Eosinophils Relative: 0 %
HEMATOCRIT: 31.4 % — AB (ref 36.0–46.0)
Hemoglobin: 9.6 g/dL — ABNORMAL LOW (ref 12.0–15.0)
Lymphocytes Relative: 11 %
Lymphs Abs: 1.7 10*3/uL (ref 0.7–4.0)
MCH: 23.1 pg — ABNORMAL LOW (ref 26.0–34.0)
MCHC: 30.6 g/dL (ref 30.0–36.0)
MCV: 75.7 fL — AB (ref 78.0–100.0)
MONO ABS: 1.9 10*3/uL — AB (ref 0.1–1.0)
MONOS PCT: 12 %
NEUTROS ABS: 12.4 10*3/uL — AB (ref 1.7–7.7)
Neutrophils Relative %: 77 %
Platelets: 279 10*3/uL (ref 150–400)
RBC: 4.15 MIL/uL (ref 3.87–5.11)
RDW: 18.5 % — AB (ref 11.5–15.5)
WBC: 16 10*3/uL — ABNORMAL HIGH (ref 4.0–10.5)

## 2015-10-24 LAB — URINALYSIS, ROUTINE W REFLEX MICROSCOPIC
Bilirubin Urine: NEGATIVE
Glucose, UA: NEGATIVE mg/dL
Ketones, ur: 40 mg/dL — AB
NITRITE: NEGATIVE
PROTEIN: NEGATIVE mg/dL
SPECIFIC GRAVITY, URINE: 1.01 (ref 1.005–1.030)
pH: 6 (ref 5.0–8.0)

## 2015-10-24 LAB — URINE MICROSCOPIC-ADD ON

## 2015-10-24 LAB — COMPREHENSIVE METABOLIC PANEL
ALBUMIN: 3.4 g/dL — AB (ref 3.5–5.0)
ALK PHOS: 123 U/L (ref 38–126)
ALT: 21 U/L (ref 14–54)
AST: 22 U/L (ref 15–41)
Anion gap: 9 (ref 5–15)
BILIRUBIN TOTAL: 0.6 mg/dL (ref 0.3–1.2)
BUN: 12 mg/dL (ref 6–20)
CO2: 24 mmol/L (ref 22–32)
CREATININE: 0.64 mg/dL (ref 0.44–1.00)
Calcium: 9 mg/dL (ref 8.9–10.3)
Chloride: 106 mmol/L (ref 101–111)
GFR calc Af Amer: >60 mL/min (ref >60)
GFR calc non Af Amer: >60 mL/min (ref >60)
GLUCOSE: 100 mg/dL — AB (ref 65–99)
POTASSIUM: 3.6 mmol/L (ref 3.5–5.1)
Sodium: 139 mmol/L (ref 135–145)
TOTAL PROTEIN: 7.7 g/dL (ref 6.5–8.1)

## 2015-10-24 LAB — TROPONIN I: Troponin I: <0.03 ng/mL (ref <0.031)

## 2015-10-24 MED ORDER — HYDROCODONE-ACETAMINOPHEN 5-325 MG PO TABS
1.0000 | ORAL_TABLET | Freq: Once | ORAL | Status: AC
  Administered 2015-10-24: 1 via ORAL
  Filled 2015-10-24: qty 1

## 2015-10-24 MED ORDER — OXYCODONE HCL 5 MG PO TABS
5.0000 mg | ORAL_TABLET | Freq: Four times a day (QID) | ORAL | Status: DC | PRN
Start: 2015-10-24 — End: 2015-10-25
  Administered 2015-10-24 – 2015-10-25 (×3): 5 mg via ORAL
  Filled 2015-10-24 (×3): qty 1

## 2015-10-24 MED ORDER — HYDROCHLOROTHIAZIDE 25 MG PO TABS
ORAL_TABLET | ORAL | Status: AC
  Administered 2015-10-24: 12.5 mg via ORAL
  Filled 2015-10-24: qty 1

## 2015-10-24 MED ORDER — HEPARIN (PORCINE) IN NACL 100-0.45 UNIT/ML-% IJ SOLN
1400.0000 [IU]/h | INTRAMUSCULAR | Status: DC
  Administered 2015-10-24: 1100 [IU]/h via INTRAVENOUS
  Filled 2015-10-24: qty 250

## 2015-10-24 MED ORDER — ASPIRIN 81 MG PO CHEW
CHEWABLE_TABLET | ORAL | Status: AC
  Filled 2015-10-24: qty 1

## 2015-10-24 MED ORDER — ASPIRIN EC 81 MG PO TBEC
81.0000 mg | DELAYED_RELEASE_TABLET | Freq: Every day | ORAL | Status: DC
  Administered 2015-10-25: 81 mg via ORAL
  Filled 2015-10-24: qty 1

## 2015-10-24 MED ORDER — IOHEXOL 300 MG/ML  SOLN
50.0000 mL | Freq: Once | INTRAMUSCULAR | Status: AC | PRN
  Administered 2015-10-24: 50 mL via INTRAVENOUS

## 2015-10-24 MED ORDER — ASPIRIN EC 81 MG PO TBEC
81.0000 mg | DELAYED_RELEASE_TABLET | Freq: Every day | ORAL | Status: DC
  Administered 2015-10-24: 81 mg via ORAL
  Filled 2015-10-24: qty 1

## 2015-10-24 MED ORDER — SODIUM CHLORIDE 0.9 % IJ SOLN
3.0000 mL | Freq: Two times a day (BID) | INTRAMUSCULAR | Status: DC
  Administered 2015-10-24: 3 mL via INTRAVENOUS

## 2015-10-24 MED ORDER — LISINOPRIL 10 MG PO TABS
20.0000 mg | ORAL_TABLET | Freq: Two times a day (BID) | ORAL | Status: DC
  Administered 2015-10-24 – 2015-10-25 (×2): 20 mg via ORAL
  Filled 2015-10-24 (×2): qty 2

## 2015-10-24 MED ORDER — CLONIDINE HCL 0.1 MG PO TABS
0.1000 mg | ORAL_TABLET | Freq: Two times a day (BID) | ORAL | Status: DC
  Administered 2015-10-24 – 2015-10-25 (×2): 0.1 mg via ORAL
  Filled 2015-10-24 (×2): qty 1

## 2015-10-24 MED ORDER — ACETAMINOPHEN 500 MG PO TABS: 1000.0000 mg | ORAL_TABLET | Freq: Four times a day (QID) | ORAL | Status: DC | PRN

## 2015-10-24 MED ORDER — ONDANSETRON HCL 4 MG/2ML IJ SOLN: 4.0000 mg | Freq: Four times a day (QID) | INTRAMUSCULAR | Status: DC | PRN

## 2015-10-24 MED ORDER — IOHEXOL 350 MG/ML SOLN
100.0000 mL | Freq: Once | INTRAVENOUS | Status: AC | PRN
  Administered 2015-10-24: 100 mL via INTRAVENOUS

## 2015-10-24 MED ORDER — HEPARIN BOLUS VIA INFUSION
5000.0000 [IU] | Freq: Once | INTRAVENOUS | Status: AC
  Administered 2015-10-24: 5000 [IU] via INTRAVENOUS

## 2015-10-24 MED ORDER — HYDROCHLOROTHIAZIDE 25 MG PO TABS
12.5000 mg | ORAL_TABLET | Freq: Every day | ORAL | Status: DC
  Administered 2015-10-25: 12.5 mg via ORAL
  Filled 2015-10-24: qty 1

## 2015-10-24 MED ORDER — HYDROCHLOROTHIAZIDE 25 MG PO TABS
12.5000 mg | ORAL_TABLET | Freq: Every day | ORAL | Status: DC
  Administered 2015-10-24 (×2): 12.5 mg via ORAL

## 2015-10-24 MED ORDER — ONDANSETRON HCL 4 MG PO TABS: 4.0000 mg | ORAL_TABLET | Freq: Four times a day (QID) | ORAL | Status: DC | PRN

## 2015-10-24 NOTE — H&P (Signed)
[facility] History and Physical  Margaret Avila [id number] DOB: 24-Mar-1951 DOA: 10/24/2015  Referring physician: ER PCP: No primary care provider on file.   Chief Complaint: Chest pain  HPI: Margaret Avila is a 65 y.o. female  This is a 65 year old lady who presents with right-sided neck and chest pain and cough associated with sharp pain. There is no hemoptysis. There is no fever. She says she is become slightly more short of breath than usual. She was diagnosed with left-sided breast cancer a couple months ago and has had radiotherapy and is due to have chemotherapy. It looks like she had left axillary node sampling but no lumpectomy. We do not have any records of this. Evaluation in the emergency room shows her to have bilateral pulmonary emboli and also DVT in the right internal jugular vein. There is no leg swelling. She is now being admitted for further management.   Review of Systems:  Apart from symptoms above, all systems are negative.   Past Medical History  Diagnosis Date  . Hypertension   . Hyperlipidemia   . Acid reflux   . Vertigo   . Hiatal hernia   . Cancer ([place])   . Breast cancer ([place]) Left breast cancer with lymph node removal. Completed Chemo 2016. due to start radiation 11/03/15. Treated by Dr Sherre Lain in [place]   Past Surgical History  Procedure Laterality Date  . Total knee arthroplasty    . Total hip arthroplasty    . Abdominal hysterectomy     Social History:  reports that she has never smoked. She has never used smokeless tobacco. She reports that she does not drink alcohol or use illicit drugs.  No Known Allergies   Family history: Her aunt apparently had breast cancer also  Prior to Admission medications   Medication Sig Start Date End Date Taking? Authorizing Provider  acetaminophen (TYLENOL) 500 MG tablet Take 1,000 mg by mouth every 6 (six) hours as needed for mild pain.   Yes Historical Provider, MD  aspirin EC 81 MG tablet Take 81  mg by mouth daily.   Yes Historical Provider, MD  cloNIDine (CATAPRES) 0.1 MG tablet Take 0.1 mg by mouth 2 (two) times daily.   Yes Historical Provider, MD  hydrochlorothiazide (HYDRODIURIL) 12.5 MG tablet Take 12.5 mg by mouth daily.   Yes Historical Provider, MD  lisinopril (PRINIVIL,ZESTRIL) 20 MG tablet Take 20 mg by mouth 2 (two) times daily.   Yes Historical Provider, MD   Physical Exam: Filed Vitals:   10/24/15 1630 10/24/15 1730 10/24/15 1800 10/24/15 1830  BP: 161/88 170/90 178/87 168/102  Pulse: 98 107 108 108  Temp:      TempSrc:      Resp: 27 24 22 26   Height:      Weight:      SpO2: 98% 90% 99% 98%    Wt Readings from Last 3 Encounters:  10/24/15 72.122 kg (159 lb)  08/18/12 84.369 kg (186 lb)    General:  Appears calm and comfortable. There is no respiratory distress. There is no peripheral central cyanosis. Eyes: PERRL, normal lids, irises & conjunctiva ENT: grossly normal hearing, lips & tongue Neck: no LAD, masses or thyromegaly Cardiovascular: RRR, no m/r/g. No LE edema. Telemetry: SR, no arrhythmias  Respiratory: CTA bilaterally, no w/r/r. Normal respiratory effort. No pleural rub. Abdomen: soft, ntnd Skin: no rash or induration seen on limited exam Musculoskeletal: grossly normal tone BUE/BLE Psychiatric: grossly normal mood and affect, speech fluent and appropriate  Neurologic: grossly non-focal.          Labs on Admission:  Basic Metabolic Panel:  Recent Labs Lab 10/24/15 1530  NA 139  K 3.6  CL 106  CO2 24  GLUCOSE 100*  BUN 12  CREATININE 0.64  CALCIUM 9.0   Liver Function Tests:  Recent Labs Lab 10/24/15 1530  AST 22  ALT 21  ALKPHOS 123  BILITOT 0.6  PROT 7.7  ALBUMIN 3.4*   No results for input(s): LIPASE, AMYLASE in the last 168 hours. No results for input(s): AMMONIA in the last 168 hours. CBC:  Recent Labs Lab 10/24/15 1530  WBC 16.0*  NEUTROABS 12.4*  HGB 9.6*  HCT 31.4*  MCV 75.7*  PLT 279   Cardiac  Enzymes:  Recent Labs Lab 10/24/15 1530  TROPONINI <0.03    BNP (last 3 results) No results for input(s): BNP in the last 8760 hours.  ProBNP (last 3 results) No results for input(s): PROBNP in the last 8760 hours.  CBG: No results for input(s): GLUCAP in the last 168 hours.  Radiological Exams on Admission: Ct Soft Tissue Neck W Contrast  10/24/2015  CLINICAL DATA:  Right neck pain and swelling. Patient is reportedly undergoing chemotherapy for breast cancer. EXAM: CT NECK WITH CONTRAST TECHNIQUE: Multidetector CT imaging of the neck was performed using the standard protocol following the bolus administration of intravenous contrast. CONTRAST:  177mL OMNIPAQUE IOHEXOL 350 MG/ML SOLN, 67mL OMNIPAQUE IOHEXOL 300 MG/ML SOLN COMPARISON:  None. FINDINGS: Pharynx and larynx: No focal abnormality. Salivary glands: No mass, sialolithiasis or duct dilation. Thyroid: No abnormality. Lymph nodes: There are several mildly enlarged lymph nodes in lymph node chains 2, 3, 4 and 5 in the right neck, largest 1.0 cm in the right level 2 neck (series 2/ image 39). No left neck lymphadenopathy. Vascular: Partially visualized is right subclavian MediPort with the catheter entering the superior vena cava, with the tip not seen on this study. There is a large acute expansile occlusive thrombus within the mid to lower right internal jugular vein, with associated thickening of the wall of the occluded portion of the right internal jugular vein and surrounding fat stranding. The remaining neck and visualized upper chest veins appear patent. No acute abnormality is seen in the neck arteries. Limited intracranial: No acute abnormality. Visualized orbits: No acute abnormality. Mastoids and visualized paranasal sinuses: Essentially clear . Skeleton: No aggressive appearing focal osseous lesions. Upper chest: No significant pulmonary nodules or acute consolidative airspace disease. IMPRESSION: 1. Acute occlusive deep venous  thrombosis within the mid to lower right internal jugular vein. 2. Mild nonspecific right neck lymphadenopathy. Critical Value/emergent results were called by telephone at the time of interpretation on 10/24/2015 at 5:45 pm to Dr. Francine Graven , who verbally acknowledged these results. Electronically Signed   By: Ilona Sorrel M.D.   On: 10/24/2015 17:49   Ct Angio Chest Pe W/cm &/or Wo Cm  10/24/2015  CLINICAL DATA:  Chest pain.  History breast carcinoma EXAM: CT ANGIOGRAPHY CHEST WITH CONTRAST TECHNIQUE: Multidetector CT imaging of the chest was performed using the standard protocol during bolus administration of intravenous contrast. Multiplanar CT image reconstructions and MIPs were obtained to evaluate the vascular anatomy. CONTRAST:  136mL OMNIPAQUE IOHEXOL 350 MG/ML SOLN, 33mL OMNIPAQUE IOHEXOL 300 MG/ML SOLN COMPARISON:  Chest radiograph August 18, 2012 FINDINGS: There is pulmonary embolus arising in the proximal right lower lobe pulmonary artery extending into several right lower lobe pulmonary artery branches. There is a small  posterior segment left lower lobe pulmonary embolus as well. No more central pulmonary emboli are evident. The right ventricle to left ventricle diameter ratio is less than 0.9 which is not indicative of right heart strain. There is no demonstrable thoracic aortic aneurysm or dissection. The visualized great vessels appear unremarkable. There is a small right pleural effusion with right base atelectasis. The lungs elsewhere are clear. There is adenopathy in the medial right supraclavicular region with adenopathy in this area measuring 4.5 x 4.0 cm. No other adenopathy is apparent. Pericardium is not thickened. There is a focal hiatal hernia, fairly small. There is stable elevation of the right hemidiaphragm. In the visualized upper abdomen, there are at ascending colonic diverticula without diverticulitis. Visualized upper abdominal structures appear unremarkable. There is a  mass in the left breast which abuts the chest wall, measuring 4.3 x 2.5 cm. There is skin thickening in the left breast region. There is a port slightly superficial to the right pectoral muscle anteriorly. There is degenerative change in the thoracic spine. There are no blastic or lytic bone lesions. Review of the MIP images confirms the above findings. IMPRESSION: Areas of pulmonary embolus, more on the right than on the left, without right heart strain. Right cervicothoracic adenopathy medially. Mass lesion left breast with overlying skin induration. Small right pleural effusion with right base atelectasis. Lungs elsewhere clear. Fairly small but present hiatal hernia. Stable elevation right hemidiaphragm. Critical Value/emergent results were called by telephone at the time of interpretation on 10/24/2015 at 5:44 pm to Dr. Francine Graven , who verbally acknowledged these results. Electronically Signed   By: Lowella Grip III M.D.   On: 10/24/2015 17:45    EKG: Independently reviewed. Sinus tachycardia without any acute ST-T wave changes.  Assessment/Plan   1. Bilateral pulmonary embolism. She also appears to have DVT in the right internal jugular vein. This may be the source. She will be treated with intravenous heparin and this can be transitioned probably tomorrow to an oral anticoagulation for chronic management. 2. Left breast cancer. I will ask oncology for any further recommendations. We will need to obtain records from [place] where she is being treated for her rest cancer. 3. Hypertension. Stable. Continue with home medications.  She'll be admitted to the telemetry floor. Further recommendations will depend on patient's hospital progress.   Code Status: Full code.   DVT Prophylaxis: Intravenous heparin.  Family Communication: I discussed the plan with patient at the bedside.   Disposition Plan: Home when medically stable.  Time spent: 60 minutes.  Doree Albee [facility] Pager [phone]

## 2015-10-24 NOTE — Progress Notes (Signed)
ANTICOAGULATION CONSULT NOTE - Initial Consult  Pharmacy Consult for Heparin Indication: pulmonary embolus  No Known Allergies  Patient Measurements: Height: 5\' 3"  (160 cm) Weight: 159 lb (72.122 kg) IBW/kg (Calculated) : 52.4 HEPARIN DW (KG): 67.5  Vital Signs: Temp: 97.7 F (36.5 C) (01/15 1309) Temp Source: Temporal (01/15 1309) BP: 161/88 mmHg (01/15 1630) Pulse Rate: 98 (01/15 1630)  Labs:  Recent Labs  10/24/15 1530  HGB 9.6*  HCT 31.4*  PLT 279  CREATININE 0.64  TROPONINI <0.03    Estimated Creatinine Clearance: 65.8 mL/min (by C-G formula based on Cr of 0.64).   Medical History: Past Medical History  Diagnosis Date  . Hypertension   . Hyperlipidemia   . Acid reflux   . Vertigo   . Hiatal hernia   . Cancer ([place])   . Breast cancer ([place]) Left breast cancer with lymph node removal. Completed Chemo 2016. due to start radiation 11/03/15. Treated by Dr Sherre Lain in [place]    Medications:   (Not in a hospital admission)  Assessment: Asked to initiate Heparin for PE. Patient was c/o swelling in neck that started Friday. Per family patient seen in ER in [place] and checked for strep which was negative. Patient told enlarged glands. Patient woke this morning c/o pain radiating from right side of neck into right hip. Patient being treated for breast cancer, last chemo treatment in December and is to start radiation January 25.  Goal of Therapy:  Heparin level 0.3-0.7 units/ml Monitor platelets by anticoagulation protocol: Yes   Plan:  Heparin 5000 units bolus now Heparin infusion at 1100 units/hr Heparin level and CBC daily  Hart Robinsons A 10/24/2015,6:11 PM

## 2015-10-24 NOTE — ED Notes (Signed)
Patient was c/o swelling in neck that started Friday. Per family patient seen in ER in [place] and checked for strep which was negative. Patient told enlarged glands. Patient woke this morning c/o pain radiating from right side of neck into right hip. Patient being treated for breast cancer, last chemo treatment in December and is to start radiation January 25.

## 2015-10-24 NOTE — ED Provider Notes (Signed)
CSN: [id number]     Arrival date & time 10/24/15  1301 History   First MD Initiated Contact with Patient 10/24/15 1516     Chief Complaint  Patient presents with  . Neck Pain  . Hip Pain      HPI  Pt was seen at 1525. Per pt and her family, c/o gradual onset and persistence of constant right sided neck "swelling" that began 2 days ago. Pt was evaluated at [facility] ED yesterday for her symptom, as was told her "strep test was negative" and her "glands were enlarged." Pt states since last night she has had right sided ribs "sharp pain," which worsens with palpation of the area. Has been associated with cough. Denies abd pain, no N/V/D, no CP/palpitations, no SOB, no back pain, no dysuria/hematuria, no injury, no focal motor weakness, no tingling/numbness in extremities.    Past Medical History  Diagnosis Date  . Hypertension   . Hyperlipidemia   . Acid reflux   . Vertigo   . Hiatal hernia   . Cancer [facility])    Past Surgical History  Procedure Laterality Date  . Total knee arthroplasty    . Total hip arthroplasty    . Abdominal hysterectomy      Social History  Substance Use Topics  . Smoking status: Never Smoker   . Smokeless tobacco: Never Used  . Alcohol Use: No   OB History    Gravida Para Term Preterm AB TAB SAB Ectopic Multiple Living   3 3 3   0  0   4     Review of Systems ROS: Statement: All systems negative except as marked or noted in the HPI; Constitutional: Negative for fever and chills. ; ; Eyes: Negative for eye pain, redness and discharge. ; ; ENMT: Negative for ear pain, hoarseness, nasal congestion, sinus pressure and +sore throat. ; ; Cardiovascular: Negative for chest pain, palpitations, diaphoresis, dyspnea and peripheral edema. ; ; Respiratory: +cough. Negative for wheezing and stridor. ; ; Gastrointestinal: Negative for nausea, vomiting, diarrhea, abdominal pain, blood in stool, hematemesis, jaundice and rectal bleeding. . ; ; Genitourinary: Negative for  dysuria, flank pain and hematuria. ; ; Musculoskeletal: +right sided posterior-lateral ribs pain. Negative for neck pain. Negative for swelling and trauma.; ; Skin: Negative for pruritus, rash, abrasions, blisters, bruising and skin lesion.; ; Neuro: Negative for headache, lightheadedness and neck stiffness. Negative for weakness, altered level of consciousness , altered mental status, extremity weakness, paresthesias, involuntary movement, seizure and syncope.      Allergies  Review of patient's allergies indicates no known allergies.  Home Medications   Prior to Admission medications   Medication Sig Start Date End Date Taking? Authorizing Provider  acetaminophen (TYLENOL) 500 MG tablet Take 1,000 mg by mouth every 6 (six) hours as needed for mild pain.   Yes Historical Provider, MD  aspirin EC 81 MG tablet Take 81 mg by mouth daily.   Yes Historical Provider, MD  cloNIDine (CATAPRES) 0.1 MG tablet Take 0.1 mg by mouth 2 (two) times daily.   Yes Historical Provider, MD  hydrochlorothiazide (HYDRODIURIL) 12.5 MG tablet Take 12.5 mg by mouth daily.   Yes Historical Provider, MD  lisinopril (PRINIVIL,ZESTRIL) 20 MG tablet Take 20 mg by mouth 2 (two) times daily.   Yes Historical Provider, MD   BP 157/87 mmHg  Pulse 102  Temp(Src) 97.7 F (36.5 C) (Temporal)  Resp 20  Ht 5\' 3"  (1.6 m)  Wt 159 lb (72.122 kg)  BMI 28.17  kg/m2  SpO2 100% Physical Exam  1530: Physical examination:  Nursing notes reviewed; Vital signs and O2 SAT reviewed;  Constitutional: Well developed, Well nourished, Well hydrated, In no acute distress; Head:  Normocephalic, atraumatic; Eyes: EOMI, PERRL, No scleral icterus; ENMT: Mouth and pharynx normal, Mucous membranes moist. Mouth and pharynx without lesions. No tonsillar exudates. No intra-oral edema. No submandibular or sublingual edema. No hoarse voice, no drooling, no stridor. No pain with manipulation of larynx. No trismus.; Neck: Supple, Full range of motion, No  lymphadenopathy; Cardiovascular: Tachycardic rate and rhythm, No gallop; Respiratory: Breath sounds clear & equal bilaterally, No wheezes.  Speaking full sentences with ease, Normal respiratory effort/excursion; Chest: Nontender, Movement normal; Abdomen: Soft, Nontender, Nondistended, Normal bowel sounds; Genitourinary: No CVA tenderness; Spine:  No midline CS, TS, LS tenderness. +TTP right posterior-lateral ribs. No rash, no ecchymosis, no soft tissue crepitus, no deformity.;; Extremities: Pulses normal, Pelvic stable. Bilat hips NT. No deformity. No edema, No calf edema or asymmetry.; Neuro: AA&Ox3, vague historian. Major CN grossly intact.  Speech clear. No gross focal motor or sensory deficits in extremities.; Skin: Color normal, Warm, Dry.    ED Course  Procedures (including critical care time) Labs Review   Imaging Review  I have personally reviewed and evaluated these images and lab results as part of my medical decision-making.   EKG Interpretation   Date/Time:  Sunday October 24 2015 15:53:55 EST Ventricular Rate:  101 PR Interval:  142 QRS Duration: 104 QT Interval:  344 QTC Calculation: 446 R Axis:   46 Text Interpretation:  Sinus tachycardia Low voltage, precordial leads  Borderline T abnormalities, inferior leads Baseline wander When compared  with ECG of 08/18/2012 No significant change was found Confirmed by  [facility]  MD, Nunzio Cory [id number]) on 10/24/2015 4:32:40 PM      MDM  MDM Reviewed: previous chart, nursing note and vitals Reviewed previous: labs and ECG Interpretation: labs, ECG, CT scan and x-ray Total time providing critical care: 30-74 minutes. This excludes time spent performing separately reportable procedures and services. Consults: admitting MD     CRITICAL CARE Performed by: Alfonzo Feller Total critical care time: 35 minutes Critical care time was exclusive of separately billable procedures and treating other patients. Critical care was  necessary to treat or prevent imminent or life-threatening deterioration. Critical care was time spent personally by me on the following activities: development of treatment plan with patient and/or surrogate as well as nursing, discussions with consultants, evaluation of patient's response to treatment, examination of patient, obtaining history from patient or surrogate, ordering and performing treatments and interventions, ordering and review of laboratory studies, ordering and review of radiographic studies, pulse oximetry and re-evaluation of patient's condition.   Results for orders placed or performed during the hospital encounter of 10/24/15  Comprehensive metabolic panel  Result Value Ref Range   Sodium 139 135 - 145 mmol/L   Potassium 3.6 3.5 - 5.1 mmol/L   Chloride 106 101 - 111 mmol/L   CO2 24 22 - 32 mmol/L   Glucose, Bld 100 (H) 65 - 99 mg/dL   BUN 12 6 - 20 mg/dL   Creatinine, Ser 0.64 0.44 - 1.00 mg/dL   Calcium 9.0 8.9 - 10.3 mg/dL   Total Protein 7.7 6.5 - 8.1 g/dL   Albumin 3.4 (L) 3.5 - 5.0 g/dL   AST 22 15 - 41 U/L   ALT 21 14 - 54 U/L   Alkaline Phosphatase 123 38 - 126 U/L   Total Bilirubin  0.6 0.3 - 1.2 mg/dL   GFR calc non Af Amer >60 >60 mL/min   GFR calc Af Amer >60 >60 mL/min   Anion gap 9 5 - 15  Troponin I  Result Value Ref Range   Troponin I <0.03 <0.031 ng/mL  CBC with Differential  Result Value Ref Range   WBC 16.0 (H) 4.0 - 10.5 K/uL   RBC 4.15 3.87 - 5.11 MIL/uL   Hemoglobin 9.6 (L) 12.0 - 15.0 g/dL   HCT 31.4 (L) 36.0 - 46.0 %   MCV 75.7 (L) 78.0 - 100.0 fL   MCH 23.1 (L) 26.0 - 34.0 pg   MCHC 30.6 30.0 - 36.0 g/dL   RDW 18.5 (H) 11.5 - 15.5 %   Platelets 279 150 - 400 K/uL   Neutrophils Relative % 77 %   Neutro Abs 12.4 (H) 1.7 - 7.7 K/uL   Lymphocytes Relative 11 %   Lymphs Abs 1.7 0.7 - 4.0 K/uL   Monocytes Relative 12 %   Monocytes Absolute 1.9 (H) 0.1 - 1.0 K/uL   Eosinophils Relative 0 %   Eosinophils Absolute 0.0 0.0 - 0.7 K/uL    Basophils Relative 0 %   Basophils Absolute 0.0 0.0 - 0.1 K/uL  Urinalysis, Routine w reflex microscopic  Result Value Ref Range   Color, Urine YELLOW YELLOW   APPearance CLEAR CLEAR   Specific Gravity, Urine 1.010 1.005 - 1.030   pH 6.0 5.0 - 8.0   Glucose, UA NEGATIVE NEGATIVE mg/dL   Hgb urine dipstick SMALL (A) NEGATIVE   Bilirubin Urine NEGATIVE NEGATIVE   Ketones, ur 40 (A) NEGATIVE mg/dL   Protein, ur NEGATIVE NEGATIVE mg/dL   Nitrite NEGATIVE NEGATIVE   Leukocytes, UA TRACE (A) NEGATIVE  Urine microscopic-add on  Result Value Ref Range   Squamous Epithelial / LPF 0-5 (A) NONE SEEN   WBC, UA 0-5 0 - 5 WBC/hpf   RBC / HPF 0-5 0 - 5 RBC/hpf   Bacteria, UA RARE (A) NONE SEEN   Ct Soft Tissue Neck W Contrast 10/24/2015  CLINICAL DATA:  Right neck pain and swelling. Patient is reportedly undergoing chemotherapy for breast cancer. EXAM: CT NECK WITH CONTRAST TECHNIQUE: Multidetector CT imaging of the neck was performed using the standard protocol following the bolus administration of intravenous contrast. CONTRAST:  181mL OMNIPAQUE IOHEXOL 350 MG/ML SOLN, 25mL OMNIPAQUE IOHEXOL 300 MG/ML SOLN COMPARISON:  None. FINDINGS: Pharynx and larynx: No focal abnormality. Salivary glands: No mass, sialolithiasis or duct dilation. Thyroid: No abnormality. Lymph nodes: There are several mildly enlarged lymph nodes in lymph node chains 2, 3, 4 and 5 in the right neck, largest 1.0 cm in the right level 2 neck (series 2/ image 39). No left neck lymphadenopathy. Vascular: Partially visualized is right subclavian MediPort with the catheter entering the superior vena cava, with the tip not seen on this study. There is a large acute expansile occlusive thrombus within the mid to lower right internal jugular vein, with associated thickening of the wall of the occluded portion of the right internal jugular vein and surrounding fat stranding. The remaining neck and visualized upper chest veins appear patent. No  acute abnormality is seen in the neck arteries. Limited intracranial: No acute abnormality. Visualized orbits: No acute abnormality. Mastoids and visualized paranasal sinuses: Essentially clear . Skeleton: No aggressive appearing focal osseous lesions. Upper chest: No significant pulmonary nodules or acute consolidative airspace disease. IMPRESSION: 1. Acute occlusive deep venous thrombosis within the mid to lower right internal  jugular vein. 2. Mild nonspecific right neck lymphadenopathy. Critical Value/emergent results were called by telephone at the time of interpretation on 10/24/2015 at 5:45 pm to Dr. Francine Graven , who verbally acknowledged these results. Electronically Signed   By: Ilona Sorrel M.D.   On: 10/24/2015 17:49   Ct Angio Chest Pe W/cm &/or Wo Cm 10/24/2015  CLINICAL DATA:  Chest pain.  History breast carcinoma EXAM: CT ANGIOGRAPHY CHEST WITH CONTRAST TECHNIQUE: Multidetector CT imaging of the chest was performed using the standard protocol during bolus administration of intravenous contrast. Multiplanar CT image reconstructions and MIPs were obtained to evaluate the vascular anatomy. CONTRAST:  191mL OMNIPAQUE IOHEXOL 350 MG/ML SOLN, 33mL OMNIPAQUE IOHEXOL 300 MG/ML SOLN COMPARISON:  Chest radiograph August 18, 2012 FINDINGS: There is pulmonary embolus arising in the proximal right lower lobe pulmonary artery extending into several right lower lobe pulmonary artery branches. There is a small posterior segment left lower lobe pulmonary embolus as well. No more central pulmonary emboli are evident. The right ventricle to left ventricle diameter ratio is less than 0.9 which is not indicative of right heart strain. There is no demonstrable thoracic aortic aneurysm or dissection. The visualized great vessels appear unremarkable. There is a small right pleural effusion with right base atelectasis. The lungs elsewhere are clear. There is adenopathy in the medial right supraclavicular region with  adenopathy in this area measuring 4.5 x 4.0 cm. No other adenopathy is apparent. Pericardium is not thickened. There is a focal hiatal hernia, fairly small. There is stable elevation of the right hemidiaphragm. In the visualized upper abdomen, there are at ascending colonic diverticula without diverticulitis. Visualized upper abdominal structures appear unremarkable. There is a mass in the left breast which abuts the chest wall, measuring 4.3 x 2.5 cm. There is skin thickening in the left breast region. There is a port slightly superficial to the right pectoral muscle anteriorly. There is degenerative change in the thoracic spine. There are no blastic or lytic bone lesions. Review of the MIP images confirms the above findings. IMPRESSION: Areas of pulmonary embolus, more on the right than on the left, without right heart strain. Right cervicothoracic adenopathy medially. Mass lesion left breast with overlying skin induration. Small right pleural effusion with right base atelectasis. Lungs elsewhere clear. Fairly small but present hiatal hernia. Stable elevation right hemidiaphragm. Critical Value/emergent results were called by telephone at the time of interpretation on 10/24/2015 at 5:44 pm to Dr. Francine Graven , who verbally acknowledged these results. Electronically Signed   By: Lowella Grip III M.D.   On: 10/24/2015 17:45   Dg Hips Bilat With Pelvis 3-4 Views 10/24/2015  CLINICAL DATA:  Pain.  History of breast carcinoma EXAM: DG HIP (WITH OR WITHOUT PELVIS) 4+V BILAT COMPARISON:  November 14, 2009 FINDINGS: Frontal pelvis as well as frontal and lateral views of each hip-total five views- were obtained. The patient is status post total hip replacement on the left with prosthetic components appearing well-seated. 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 There is mild osteoarthritic change in the right hip joint, stable. No acute fracture or dislocation. There is osteoarthritic change in the pubic  symphysis region. No blastic or lytic bone lesions are apparent. IMPRESSION: No apparent blastic or lytic bone lesions. No fracture or dislocation. 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 Stable osteoarthritic change right hip joint. Electronically Signed   By: Lowella Grip III M.D.   On: 10/24/2015 19:11    1820:  IV heparin started. Pt continues to c/o hip "pain," but remains NT on exam. When asked where exactly her hip hurts, she points to her right posterior-lateral chest wall. XR hips as above. Dx and testing d/w pt and family.  Questions answered.  Verb understanding, agreeable to admit. T/C to [facility] Dr. Anastasio Champion, case discussed, including:  HPI, pertinent PM/SHx, VS/PE, dx testing, ED course and treatment:  Agreeable to admit, requests to write temporary orders, obtain tele bed to team APAdmits.   Francine Graven, DO 10/27/15 1708

## 2015-10-25 DIAGNOSIS — I82C11 Acute embolism and thrombosis of right internal jugular vein: Secondary | ICD-10-CM | POA: Diagnosis not present

## 2015-10-25 DIAGNOSIS — I10 Essential (primary) hypertension: Secondary | ICD-10-CM | POA: Diagnosis not present

## 2015-10-25 DIAGNOSIS — C50912 Malignant neoplasm of unspecified site of left female breast: Secondary | ICD-10-CM | POA: Diagnosis not present

## 2015-10-25 DIAGNOSIS — I2699 Other pulmonary embolism without acute cor pulmonale: Secondary | ICD-10-CM | POA: Diagnosis not present

## 2015-10-25 DIAGNOSIS — R079 Chest pain, unspecified: Secondary | ICD-10-CM | POA: Diagnosis not present

## 2015-10-25 LAB — COMPREHENSIVE METABOLIC PANEL
ALT: 16 U/L (ref 14–54)
AST: 16 U/L (ref 15–41)
Albumin: 3.1 g/dL — ABNORMAL LOW (ref 3.5–5.0)
Alkaline Phosphatase: 117 U/L (ref 38–126)
Anion gap: 9 (ref 5–15)
BUN: 10 mg/dL (ref 6–20)
CHLORIDE: 101 mmol/L (ref 101–111)
CO2: 25 mmol/L (ref 22–32)
Calcium: 8.8 mg/dL — ABNORMAL LOW (ref 8.9–10.3)
Creatinine, Ser: 0.65 mg/dL (ref 0.44–1.00)
Glucose, Bld: 100 mg/dL — ABNORMAL HIGH (ref 65–99)
POTASSIUM: 3.3 mmol/L — AB (ref 3.5–5.1)
SODIUM: 135 mmol/L (ref 135–145)
Total Bilirubin: 0.6 mg/dL (ref 0.3–1.2)
Total Protein: 7 g/dL (ref 6.5–8.1)

## 2015-10-25 LAB — CBC
HEMATOCRIT: 29 % — AB (ref 36.0–46.0)
HEMOGLOBIN: 9 g/dL — AB (ref 12.0–15.0)
MCH: 23.2 pg — AB (ref 26.0–34.0)
MCHC: 31 g/dL (ref 30.0–36.0)
MCV: 74.7 fL — AB (ref 78.0–100.0)
Platelets: 276 10*3/uL (ref 150–400)
RBC: 3.88 MIL/uL (ref 3.87–5.11)
RDW: 18.7 % — ABNORMAL HIGH (ref 11.5–15.5)
WBC: 12.2 10*3/uL — AB (ref 4.0–10.5)

## 2015-10-25 LAB — HEPARIN LEVEL (UNFRACTIONATED): Heparin Unfractionated: 0.18 IU/mL — ABNORMAL LOW (ref 0.30–0.70)

## 2015-10-25 LAB — MRSA PCR SCREENING: MRSA BY PCR: NEGATIVE

## 2015-10-25 MED ORDER — RIVAROXABAN 20 MG PO TABS: ORAL_TABLET | ORAL | Status: AC

## 2015-10-25 MED ORDER — RIVAROXABAN 20 MG PO TABS: 20.0000 mg | ORAL_TABLET | Freq: Every day | ORAL | Status: DC

## 2015-10-25 MED ORDER — RIVAROXABAN (XARELTO) VTE STARTER PACK (15 & 20 MG)
ORAL_TABLET | ORAL | Status: DC
Start: 2015-10-25 — End: 2016-08-21

## 2015-10-25 MED ORDER — RIVAROXABAN 15 MG PO TABS
15.0000 mg | ORAL_TABLET | Freq: Two times a day (BID) | ORAL | Status: DC
  Administered 2015-10-25 (×2): 15 mg via ORAL
  Filled 2015-10-25 (×2): qty 1

## 2015-10-25 MED ORDER — HEPARIN BOLUS VIA INFUSION
1000.0000 [IU] | Freq: Once | INTRAVENOUS | Status: AC
  Administered 2015-10-25: 1000 [IU] via INTRAVENOUS
  Filled 2015-10-25: qty 1000

## 2015-10-25 MED ORDER — OXYCODONE HCL 5 MG PO TABS: 5.0000 mg | ORAL_TABLET | Freq: Four times a day (QID) | ORAL | Status: DC | PRN

## 2015-10-25 NOTE — Progress Notes (Signed)
ANTICOAGULATION CONSULT NOTE - FOLLOW UP  Pharmacy Consult for Heparin Indication: pulmonary embolus / DVT  No Known Allergies  Patient Measurements: Height: 5\' 3"  (160 cm) Weight: 166 lb 0.1 oz (75.3 kg) IBW/kg (Calculated) : 52.4 HEPARIN DW (KG): 68.4  Vital Signs: Temp: 98 F (36.7 C) (01/16 0400) Temp Source: Oral (01/16 0400) BP: 138/80 mmHg (01/16 0600) Pulse Rate: 97 (01/16 0600)  Labs:  Recent Labs  10/24/15 1530 10/25/15 0503  HGB 9.6* 9.0*  HCT 31.4* 29.0*  PLT 279 276  HEPARINUNFRC  --  0.18*  CREATININE 0.64 0.65  TROPONINI <0.03  --    Estimated Creatinine Clearance: 67.3 mL/min (by C-G formula based on Cr of 0.65).  Medical History: Past Medical History  Diagnosis Date  . Hypertension   . Hyperlipidemia   . Acid reflux   . Vertigo   . Hiatal hernia   . Cancer ([place])   . Breast cancer ([place]) Left breast cancer with lymph node removal. Completed Chemo 2016. due to start radiation 11/03/15. Treated by Dr Sherre Lain in [place]   Medications:  Prescriptions prior to admission  Medication Sig Dispense Refill Last Dose  . acetaminophen (TYLENOL) 500 MG tablet Take 1,000 mg by mouth every 6 (six) hours as needed for mild pain.   10/24/2015  . aspirin EC 81 MG tablet Take 81 mg by mouth daily.   10/24/2015  . cloNIDine (CATAPRES) 0.1 MG tablet Take 0.1 mg by mouth 2 (two) times daily.   10/24/2015  . hydrochlorothiazide (HYDRODIURIL) 12.5 MG tablet Take 12.5 mg by mouth daily.   10/24/2015  . lisinopril (PRINIVIL,ZESTRIL) 20 MG tablet Take 20 mg by mouth 2 (two) times daily.   10/24/2015   Assessment: Asked to initiate Heparin for PE. Patient was c/o swelling in neck that started Friday. Per family patient seen in ER in [place] and checked for strep which was negative. Patient told enlarged glands. Patient woke this morning c/o pain radiating from right side of neck into right hip.  Evaluation in the emergency room shows her to have bilateral pulmonary emboli and  also DVT in the right internal jugular vein. There is no leg swelling.  Patient being treated for breast cancer, last chemo treatment in December and is to start radiation January 25.  Heparin level is below goal.  Goal of Therapy:  Heparin level 0.3-0.7 units/ml Monitor platelets by anticoagulation protocol: Yes   Plan:  Heparin 1000 units bolus now Increase Heparin infusion to 1400 units/hr Recheck heparin level today at 2pm. Heparin level and CBC daily  [place] Crane, Clarisa Danser A 10/25/2015,7:51 AM

## 2015-10-25 NOTE — Progress Notes (Signed)
ANTICOAGULATION CONSULT NOTE - FOLLOW UP  Pharmacy Consult for XARELTO Indication: pulmonary embolus / DVT  No Known Allergies  Patient Measurements: Height: 5\' 3"  (160 cm) Weight: 166 lb 0.1 oz (75.3 kg) IBW/kg (Calculated) : 52.4 HEPARIN DW (KG): 68.4  Vital Signs: Temp: 97.7 F (36.5 C) (01/16 0800) Temp Source: Oral (01/16 0800) BP: 138/80 mmHg (01/16 0600) Pulse Rate: 97 (01/16 0600)  Labs:  Recent Labs  10/24/15 1530 10/25/15 0503  HGB 9.6* 9.0*  HCT 31.4* 29.0*  PLT 279 276  HEPARINUNFRC  --  0.18*  CREATININE 0.64 0.65  TROPONINI <0.03  --    Estimated Creatinine Clearance: 67.3 mL/min (by C-G formula based on Cr of 0.65).  Medical History: Past Medical History  Diagnosis Date  . Hypertension   . Hyperlipidemia   . Acid reflux   . Vertigo   . Hiatal hernia   . Cancer ([place])   . Breast cancer ([place]) Left breast cancer with lymph node removal. Completed Chemo 2016. due to start radiation 11/03/15. Treated by Dr Sherre Lain in [place]   Medications:  Prescriptions prior to admission  Medication Sig Dispense Refill Last Dose  . acetaminophen (TYLENOL) 500 MG tablet Take 1,000 mg by mouth every 6 (six) hours as needed for mild pain.   10/24/2015  . aspirin EC 81 MG tablet Take 81 mg by mouth daily.   10/24/2015  . cloNIDine (CATAPRES) 0.1 MG tablet Take 0.1 mg by mouth 2 (two) times daily.   10/24/2015  . hydrochlorothiazide (HYDRODIURIL) 12.5 MG tablet Take 12.5 mg by mouth daily.   10/24/2015  . lisinopril (PRINIVIL,ZESTRIL) 20 MG tablet Take 20 mg by mouth 2 (two) times daily.   10/24/2015   Assessment: Asked to initiate Heparin for PE when admitted. Patient was c/o swelling in neck that started Friday. Per family patient seen in ER in [place] and checked for strep which was negative. Patient told enlarged glands. Patient woke this morning c/o pain radiating from right side of neck into right hip. Evaluation in the emergency room shows her to have bilateral pulmonary  emboli and also DVT in the right internal jugular vein. There is no leg swelling.  Pt will be switched to XARELTO today.  SCr OK.  Goal of Therapy:  Treatment dose of XARELTO for DVT Monitor platelets by anticoagulation protocol: Yes   Plan: Xarelto 15mg  PO bid x 21 days then Xarelto 20mg  po daily thereafter Monitor for s/sx of bleeding Provide patient education  Hart Robinsons A 10/25/2015,10:04 AM

## 2015-10-25 NOTE — Care Management (Signed)
Patient given coupon for 30 free trial Xarelto and notified that monthly co pay with her insurance would be $3.00 Family present in room. Notified patient nurse Serena Colonel.

## 2015-10-25 NOTE — Discharge Summary (Signed)
Discharge Summary  Margaret Avila [id number] DOB: [birth year]  PCP: Antionette Fairy, PA-C  Admit date: 10/24/2015 Discharge date: 10/25/2015  Time spent: 35 minutes   Recommendations for Outpatient Follow-up:  1. New medication: Xarelto 15 mg by mouth twice a day 21 days, then changed 20 mg by mouth daily for the next 6 months 2. New medication: OxyIR 5 mg by mouth every 4 hours when necessary for pain 3. Patient will follow-up with her PCP within the next 4 weeks  4. It is advised that patient have a repeat CT scan of the soft tissue of the neck in 6 months prior to stopping anticoagulation. This is for follow-up DVT seen in right IJ  Discharge Diagnoses:  Active Hospital Problems   Diagnosis Date Noted  . Bilateral pulmonary embolism ([place]) 10/24/2015  . Hypertension 10/24/2015  . Breast cancer ([place]) 10/24/2015  . Multiple pulmonary emboli ([place]) 10/24/2015    Resolved Hospital Problems   Diagnosis Date Noted Date Resolved  No resolved problems to display.    Discharge Condition: Improved, being discharged home  Diet recommendation: Low-sodium   Filed Weights   10/24/15 1309 10/24/15 2121 10/25/15 0500  Weight: 72.122 kg (159 lb) 75.3 kg (166 lb 0.1 oz) 75.3 kg (166 lb 0.1 oz)    History of present illness:  Patient is a 65 year old African-American female with past medical history of hypertension and breast cancer recently completing treatment started complaining of back pain located above the right flank which she called hip pain as well as neck pain. She came into the emergency room for further evaluation and at that time was found by CT scan of the neck to have a venous thrombosis noted in her right internal jugular vein. Patient also had a CT and she'll of the chest done which noted bilateral pulmonary emboli including extension down into the right lower lobe which matched up with were patient was complaining of right back pain above her right flank.  Patient started  on heparin and admitted to the hospitalist service.  Hospital Course:  Active Problems:   Bilateral pulmonary embolism ([place]) felt to be secondary to hyper-coag will stay brought on by cancer. Given patient's normal renal function, felt to be excellent Candidate for Xarelto. Patient given education and information and was started on Xarelto 15 mg by mouth twice a day for the first 21 days, then increase to 20 mg by mouth daily. Patient may end up being on anticoagulation lifelong, but we'll determine this after 6 months. Because of pain persisting, given prescription for OxyIR with explanation that this will improve over time as clot get smaller    Hypertension: Stable    Breast cancer [facility]): Stable  venous thromboses noted in right IJ: Discussed with hematology as well as critical care. Since thrombosis is not fully obstructing although unusual location for thrombus, no reason to treat any differently. Continue anticoagulation, however recommending to PCP that prior to discontinuing Xarelto, she has a repeat CT scan of her neck to ensure that thrombus has resolved.   Procedures:  None   Consultations:  None although case discussed with critical care and hematology   Discharge Exam: BP 140/74 mmHg  Pulse 105  Temp(Src) 97.5 F (36.4 C) (Oral)  Resp 20  Ht 5\' 3"  (1.6 m)  Wt 75.3 kg (166 lb 0.1 oz)  BMI 29.41 kg/m2  SpO2 100%  General: Alert and oriented 3   Cardiovascular: Regular rate and rhythm, S1-S2   Respiratory: Clear to auscultation  bilaterally   Discharge Instructions You were cared for by a hospitalist during your hospital stay. If you have any questions about your discharge medications or the care you received while you were in the hospital after you are discharged, you can call the unit and asked to speak with the hospitalist on call if the hospitalist that took care of you is not available. Once you are discharged, your primary care physician will handle any further  medical issues. Please note that NO REFILLS for any discharge medications will be authorized once you are discharged, as it is imperative that you return to your primary care physician (or establish a relationship with a primary care physician if you do not have one) for your aftercare needs so that they can reassess your need for medications and monitor your lab values.  Discharge Instructions    Diet - low sodium heart healthy    Complete by:  As directed      Increase activity slowly    Complete by:  As directed             Medication List    STOP taking these medications        aspirin EC 81 MG tablet      TAKE these medications        acetaminophen 500 MG tablet  Commonly known as:  TYLENOL  Take 1,000 mg by mouth every 6 (six) hours as needed for mild pain.     cloNIDine 0.1 MG tablet  Commonly known as:  CATAPRES  Take 0.1 mg by mouth 2 (two) times daily.     hydrochlorothiazide 12.5 MG tablet  Commonly known as:  HYDRODIURIL  Take 12.5 mg by mouth daily.     lisinopril 20 MG tablet  Commonly known as:  PRINIVIL,ZESTRIL  Take 20 mg by mouth 2 (two) times daily.     oxyCODONE 5 MG immediate release tablet  Commonly known as:  Oxy IR/ROXICODONE  Take 1 tablet (5 mg total) by mouth every 6 (six) hours as needed for moderate pain.     Rivaroxaban 15 & 20 MG Tbpk  Commonly known as:  XARELTO STARTER PACK  Take as directed on package: Start with one 15mg  tablet by mouth twice a day with food. On Day 22, switch to one 20mg  tablet once a day with food.     rivaroxaban 20 MG Tabs tablet  Commonly known as:  XARELTO  Start after 11/23/14. 20mg  po daily       No Known Allergies     Follow-up Information    Follow up with BAUCOM, JENNY B, PA-C In 1 month.   Specialty:  Physician Assistant   Contact information:   [address] [place] [address] [phone]        The results of significant diagnostics from this hospitalization (including imaging,  microbiology, ancillary and laboratory) are listed below for reference.    Significant Diagnostic Studies: Ct Soft Tissue Neck W Contrast  10/24/2015  CLINICAL DATA:  Right neck pain and swelling. Patient is reportedly undergoing chemotherapy for breast cancer. EXAM: CT NECK WITH CONTRAST TECHNIQUE: Multidetector CT imaging of the neck was performed using the standard protocol following the bolus administration of intravenous contrast. CONTRAST:  123mL OMNIPAQUE IOHEXOL 350 MG/ML SOLN, 73mL OMNIPAQUE IOHEXOL 300 MG/ML SOLN COMPARISON:  None. FINDINGS: Pharynx and larynx: No focal abnormality. Salivary glands: No mass, sialolithiasis or duct dilation. Thyroid: No abnormality. Lymph nodes: There are several mildly enlarged lymph  nodes in lymph node chains 2, 3, 4 and 5 in the right neck, largest 1.0 cm in the right level 2 neck (series 2/ image 39). No left neck lymphadenopathy. Vascular: Partially visualized is right subclavian MediPort with the catheter entering the superior vena cava, with the tip not seen on this study. There is a large acute expansile occlusive thrombus within the mid to lower right internal jugular vein, with associated thickening of the wall of the occluded portion of the right internal jugular vein and surrounding fat stranding. The remaining neck and visualized upper chest veins appear patent. No acute abnormality is seen in the neck arteries. Limited intracranial: No acute abnormality. Visualized orbits: No acute abnormality. Mastoids and visualized paranasal sinuses: Essentially clear . Skeleton: No aggressive appearing focal osseous lesions. Upper chest: No significant pulmonary nodules or acute consolidative airspace disease. IMPRESSION: 1. Acute occlusive deep venous thrombosis within the mid to lower right internal jugular vein. 2. Mild nonspecific right neck lymphadenopathy. Critical Value/emergent results were called by telephone at the time of interpretation on 10/24/2015 at 5:45  pm to Dr. Francine Graven , who verbally acknowledged these results. Electronically Signed   By: Ilona Sorrel M.D.   On: 10/24/2015 17:49   Ct Angio Chest Pe W/cm &/or Wo Cm  10/24/2015  CLINICAL DATA:  Chest pain.  History breast carcinoma EXAM: CT ANGIOGRAPHY CHEST WITH CONTRAST TECHNIQUE: Multidetector CT imaging of the chest was performed using the standard protocol during bolus administration of intravenous contrast. Multiplanar CT image reconstructions and MIPs were obtained to evaluate the vascular anatomy. CONTRAST:  130mL OMNIPAQUE IOHEXOL 350 MG/ML SOLN, 38mL OMNIPAQUE IOHEXOL 300 MG/ML SOLN COMPARISON:  Chest radiograph August 18, 2012 FINDINGS: There is pulmonary embolus arising in the proximal right lower lobe pulmonary artery extending into several right lower lobe pulmonary artery branches. There is a small posterior segment left lower lobe pulmonary embolus as well. No more central pulmonary emboli are evident. The right ventricle to left ventricle diameter ratio is less than 0.9 which is not indicative of right heart strain. There is no demonstrable thoracic aortic aneurysm or dissection. The visualized great vessels appear unremarkable. There is a small right pleural effusion with right base atelectasis. The lungs elsewhere are clear. There is adenopathy in the medial right supraclavicular region with adenopathy in this area measuring 4.5 x 4.0 cm. No other adenopathy is apparent. Pericardium is not thickened. There is a focal hiatal hernia, fairly small. There is stable elevation of the right hemidiaphragm. In the visualized upper abdomen, there are at ascending colonic diverticula without diverticulitis. Visualized upper abdominal structures appear unremarkable. There is a mass in the left breast which abuts the chest wall, measuring 4.3 x 2.5 cm. There is skin thickening in the left breast region. There is a port slightly superficial to the right pectoral muscle anteriorly. There is  degenerative change in the thoracic spine. There are no blastic or lytic bone lesions. Review of the MIP images confirms the above findings. IMPRESSION: Areas of pulmonary embolus, more on the right than on the left, without right heart strain. Right cervicothoracic adenopathy medially. Mass lesion left breast with overlying skin induration. Small right pleural effusion with right base atelectasis. Lungs elsewhere clear. Fairly small but present hiatal hernia. Stable elevation right hemidiaphragm. Critical Value/emergent results were called by telephone at the time of interpretation on 10/24/2015 at 5:44 pm to Dr. Francine Graven , who verbally acknowledged these results. Electronically Signed   By: Lowella Grip III M.D.  On: 10/24/2015 17:45   Dg Hips Bilat With Pelvis 3-4 Views  10/24/2015  CLINICAL DATA:  Pain.  History of breast carcinoma EXAM: DG HIP (WITH OR WITHOUT PELVIS) 4+V BILAT COMPARISON:  November 14, 2009 FINDINGS: Frontal pelvis as well as frontal and lateral views of each hip-total five views- were obtained. The patient is status post total hip replacement on the left with prosthetic components appearing well-seated. 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 There is mild osteoarthritic change in the right hip joint, stable. No acute fracture or dislocation. There is osteoarthritic change in the pubic symphysis region. No blastic or lytic bone lesions are apparent. IMPRESSION: No apparent blastic or lytic bone lesions. No fracture or dislocation. 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 Stable osteoarthritic change right hip joint. Electronically Signed   By: Lowella Grip III M.D.   On: 10/24/2015 19:11    Microbiology: Recent Results (from the past 240 hour(s))  MRSA PCR Screening     Status: None   Collection Time: 10/24/15  9:15 PM  Result Value Ref Range Status   MRSA by PCR NEGATIVE NEGATIVE Final    Comment:        The GeneXpert MRSA  Assay (FDA approved for NASAL specimens only), is one component of a comprehensive MRSA colonization surveillance program. It is not intended to diagnose MRSA infection nor to guide or monitor treatment for MRSA infections.      Labs: Basic Metabolic Panel:  Recent Labs Lab 10/24/15 1530 10/25/15 0503  NA 139 135  K 3.6 3.3*  CL 106 101  CO2 24 25  GLUCOSE 100* 100*  BUN 12 10  CREATININE 0.64 0.65  CALCIUM 9.0 8.8*   Liver Function Tests:  Recent Labs Lab 10/24/15 1530 10/25/15 0503  AST 22 16  ALT 21 16  ALKPHOS 123 117  BILITOT 0.6 0.6  PROT 7.7 7.0  ALBUMIN 3.4* 3.1*   No results for input(s): LIPASE, AMYLASE in the last 168 hours. No results for input(s): AMMONIA in the last 168 hours. CBC:  Recent Labs Lab 10/24/15 1530 10/25/15 0503  WBC 16.0* 12.2*  NEUTROABS 12.4*  --   HGB 9.6* 9.0*  HCT 31.4* 29.0*  MCV 75.7* 74.7*  PLT 279 276   Cardiac Enzymes:  Recent Labs Lab 10/24/15 1530  TROPONINI <0.03   BNP: BNP (last 3 results) No results for input(s): BNP in the last 8760 hours.  ProBNP (last 3 results) No results for input(s): PROBNP in the last 8760 hours.  CBG: No results for input(s): GLUCAP in the last 168 hours.     Signed:  Annita Brod  [facility] 10/25/2015, 6:55 PM

## 2015-10-25 NOTE — Progress Notes (Signed)
D/c instructions reviewed with patient. Verbalized understanding. Pt dc'd to home with daughter.

## 2015-10-25 NOTE — Discharge Instructions (Addendum)
Information on my medicine - XARELTO (rivaroxaban)  This medication education was reviewed with me or my healthcare representative as part of my discharge preparation.  The pharmacist that spoke with me during my hospital stay was:  Ena Dawley, [place]? Xarelto was prescribed to treat blood clots that may have been found in the veins of your legs (deep vein thrombosis) or in your lungs (pulmonary embolism) and to reduce the risk of them occurring again.  What do you need to know about Xarelto? The starting dose is one 15 mg tablet taken TWICE daily with food for the FIRST 21 DAYS then on (enter date)  11/15/15  the dose is changed to one 20 mg tablet taken ONCE A DAY with your evening meal.  DO NOT stop taking Xarelto without talking to the health care provider who prescribed the medication.  Refill your prescription for 20 mg tablets before you run out.  After discharge, you should have regular check-up appointments with your healthcare provider that is prescribing your Xarelto.  In the future your dose may need to be changed if your kidney function changes by a significant amount.  What do you do if you miss a dose? If you are taking Xarelto TWICE DAILY and you miss a dose, take it as soon as you remember. You may take two 15 mg tablets (total 30 mg) at the same time then resume your regularly scheduled 15 mg twice daily the next day.  If you are taking Xarelto ONCE DAILY and you miss a dose, take it as soon as you remember on the same day then continue your regularly scheduled once daily regimen the next day. Do not take two doses of Xarelto at the same time.   Important Safety Information Xarelto is a blood thinner medicine that can cause bleeding. You should call your healthcare provider right away if you experience any of the following: ? Bleeding from an injury or your nose that does not stop. ? Unusual colored urine (red or dark brown) or  unusual colored stools (red or black). ? Unusual bruising for unknown reasons. ? A serious fall or if you hit your head (even if there is no bleeding).  Some medicines may interact with Xarelto and might increase your risk of bleeding while on Xarelto. To help avoid this, consult your healthcare provider or pharmacist prior to using any new prescription or non-prescription medications, including herbals, vitamins, non-steroidal anti-inflammatory drugs (NSAIDs) and supplements.  This website has more information on Xarelto: [email or web address]

## 2015-11-23 ENCOUNTER — Emergency Department (HOSPITAL_COMMUNITY)
Admission: EM | Admit: 2015-11-23 | Discharge: 2015-11-23 | Disposition: A | Discharge status: Home / Self Care | Payer: Medicare Other | Attending: Emergency Medicine | Admitting: Emergency Medicine

## 2015-11-23 ENCOUNTER — Emergency Department (HOSPITAL_COMMUNITY): Payer: Medicare Other

## 2015-11-23 ENCOUNTER — Encounter (HOSPITAL_COMMUNITY): Payer: Self-pay | Admitting: Emergency Medicine

## 2015-11-23 DIAGNOSIS — Z7901 Long term (current) use of anticoagulants: Secondary | ICD-10-CM | POA: Diagnosis not present

## 2015-11-23 DIAGNOSIS — Z853 Personal history of malignant neoplasm of breast: Secondary | ICD-10-CM | POA: Diagnosis not present

## 2015-11-23 DIAGNOSIS — Z79899 Other long term (current) drug therapy: Secondary | ICD-10-CM | POA: Diagnosis not present

## 2015-11-23 DIAGNOSIS — W01198A Fall on same level from slipping, tripping and stumbling with subsequent striking against other object, initial encounter: Secondary | ICD-10-CM | POA: Insufficient documentation

## 2015-11-23 DIAGNOSIS — Y9289 Other specified places as the place of occurrence of the external cause: Secondary | ICD-10-CM | POA: Diagnosis not present

## 2015-11-23 DIAGNOSIS — Z8719 Personal history of other diseases of the digestive system: Secondary | ICD-10-CM | POA: Insufficient documentation

## 2015-11-23 DIAGNOSIS — W19XXXA Unspecified fall, initial encounter: Secondary | ICD-10-CM

## 2015-11-23 DIAGNOSIS — S60052A Contusion of left little finger without damage to nail, initial encounter: Secondary | ICD-10-CM | POA: Diagnosis not present

## 2015-11-23 DIAGNOSIS — Z8639 Personal history of other endocrine, nutritional and metabolic disease: Secondary | ICD-10-CM | POA: Diagnosis not present

## 2015-11-23 DIAGNOSIS — I10 Essential (primary) hypertension: Secondary | ICD-10-CM | POA: Insufficient documentation

## 2015-11-23 DIAGNOSIS — S0083XA Contusion of other part of head, initial encounter: Secondary | ICD-10-CM | POA: Insufficient documentation

## 2015-11-23 DIAGNOSIS — Y9389 Activity, other specified: Secondary | ICD-10-CM | POA: Diagnosis not present

## 2015-11-23 DIAGNOSIS — Y998 Other external cause status: Secondary | ICD-10-CM | POA: Insufficient documentation

## 2015-11-23 DIAGNOSIS — R42 Dizziness and giddiness: Secondary | ICD-10-CM | POA: Diagnosis not present

## 2015-11-23 DIAGNOSIS — S0990XA Unspecified injury of head, initial encounter: Secondary | ICD-10-CM | POA: Diagnosis present

## 2015-11-23 LAB — URINALYSIS, ROUTINE W REFLEX MICROSCOPIC
BILIRUBIN URINE: NEGATIVE
Glucose, UA: NEGATIVE mg/dL
HGB URINE DIPSTICK: NEGATIVE
KETONES UR: NEGATIVE mg/dL
NITRITE: NEGATIVE
PH: 7 (ref 5.0–8.0)
Protein, ur: NEGATIVE mg/dL
Specific Gravity, Urine: 1.015 (ref 1.005–1.030)

## 2015-11-23 LAB — COMPREHENSIVE METABOLIC PANEL
ALK PHOS: 102 U/L (ref 38–126)
ALT: 11 U/L — AB (ref 14–54)
AST: 16 U/L (ref 15–41)
Albumin: 3.6 g/dL (ref 3.5–5.0)
Anion gap: 9 (ref 5–15)
BUN: 13 mg/dL (ref 6–20)
CALCIUM: 9.1 mg/dL (ref 8.9–10.3)
CO2: 27 mmol/L (ref 22–32)
CREATININE: 0.68 mg/dL (ref 0.44–1.00)
Chloride: 105 mmol/L (ref 101–111)
Glucose, Bld: 96 mg/dL (ref 65–99)
Potassium: 3.3 mmol/L — ABNORMAL LOW (ref 3.5–5.1)
Sodium: 141 mmol/L (ref 135–145)
Total Bilirubin: 0.3 mg/dL (ref 0.3–1.2)
Total Protein: 7.6 g/dL (ref 6.5–8.1)

## 2015-11-23 LAB — CBC WITH DIFFERENTIAL/PLATELET
BASOS PCT: 0 %
Basophils Absolute: 0 10*3/uL (ref 0.0–0.1)
Eosinophils Absolute: 0.1 10*3/uL (ref 0.0–0.7)
Eosinophils Relative: 2 %
HEMATOCRIT: 34.8 % — AB (ref 36.0–46.0)
Hemoglobin: 10.4 g/dL — ABNORMAL LOW (ref 12.0–15.0)
LYMPHS ABS: 1.7 10*3/uL (ref 0.7–4.0)
Lymphocytes Relative: 25 %
MCH: 22.6 pg — AB (ref 26.0–34.0)
MCHC: 29.9 g/dL — AB (ref 30.0–36.0)
MCV: 75.7 fL — ABNORMAL LOW (ref 78.0–100.0)
MONO ABS: 0.5 10*3/uL (ref 0.1–1.0)
MONOS PCT: 7 %
NEUTROS ABS: 4.6 10*3/uL (ref 1.7–7.7)
Neutrophils Relative %: 66 %
Platelets: 268 10*3/uL (ref 150–400)
RBC: 4.6 MIL/uL (ref 3.87–5.11)
RDW: 17.6 % — AB (ref 11.5–15.5)
WBC: 7 10*3/uL (ref 4.0–10.5)

## 2015-11-23 LAB — PROTIME-INR
INR: 1.54 — AB (ref 0.00–1.49)
Prothrombin Time: 18.6 seconds — ABNORMAL HIGH (ref 11.6–15.2)

## 2015-11-23 LAB — I-STAT TROPONIN, ED: Troponin i, poc: 0 ng/mL (ref 0.00–0.08)

## 2015-11-23 LAB — URINE MICROSCOPIC-ADD ON: RBC / HPF: NONE SEEN RBC/hpf (ref 0–5)

## 2015-11-23 MED ORDER — ACETAMINOPHEN 325 MG PO TABS
650.0000 mg | ORAL_TABLET | Freq: Once | ORAL | Status: AC
Start: 2015-11-23 — End: 2015-11-23
  Administered 2015-11-23: 650 mg via ORAL

## 2015-11-23 MED ORDER — SODIUM CHLORIDE 0.9 % IV BOLUS (SEPSIS)
500.0000 mL | Freq: Once | INTRAVENOUS | Status: AC
  Administered 2015-11-23: 500 mL via INTRAVENOUS

## 2015-11-23 MED ORDER — ACETAMINOPHEN 325 MG PO TABS
ORAL_TABLET | ORAL | Status: AC
  Filled 2015-11-23: qty 2

## 2015-11-23 NOTE — ED Notes (Signed)
Pt brought in by EMS for fall. Pt reports she became dizzy and fell. Hematoma noted to L upper occipital region. Pt alert and oriented. No LOC or emesis. 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 Neuro intact and WDL.

## 2015-11-23 NOTE — ED Provider Notes (Signed)
CSN: [id number]     Arrival date & time 11/23/15  45 History   First MD Initiated Contact with Patient 11/23/15 1603     Chief Complaint  Patient presents with  . Fall     (Consider location/radiation/quality/duration/timing/severity/associated sxs/prior Treatment) Patient is a 65 y.o. female presenting with fall. The history is provided by the patient (Patient states that she got dizzy and fell hit her head and left hand. Patient has pain in her hand now).  Fall This is a new problem. The current episode started 3 to 5 hours ago. The problem occurs rarely. The problem has been resolved. Pertinent negatives include no chest pain, no abdominal pain and no headaches. Nothing aggravates the symptoms. Nothing relieves the symptoms.    Past Medical History  Diagnosis Date  . Hypertension   . Hyperlipidemia   . Acid reflux   . Vertigo   . Hiatal hernia   . Cancer ([place])   . Breast cancer ([place]) Left breast cancer with lymph node removal. Completed Chemo 2016. due to start radiation 11/03/15. Treated by Dr Sherre Lain in [place]   Past Surgical History  Procedure Laterality Date  . Total knee arthroplasty    . Total hip arthroplasty    . Abdominal hysterectomy     Family History  Problem Relation Age of Onset  . Hypertension Mother   . Hypertension Father   . Hypertension Other    Social History  Substance Use Topics  . Smoking status: Never Smoker   . Smokeless tobacco: Never Used  . Alcohol Use: No   OB History    Gravida Para Term Preterm AB TAB SAB Ectopic Multiple Living   3 3 3   0  0   4     Review of Systems  Constitutional: Negative for appetite change and fatigue.  HENT: Negative for congestion, ear discharge and sinus pressure.   Eyes: Negative for discharge.  Respiratory: Negative for cough.   Cardiovascular: Negative for chest pain.  Gastrointestinal: Negative for abdominal pain and diarrhea.  Genitourinary: Negative for frequency and hematuria.   Musculoskeletal: Negative for back pain.       Left fifth finger discomfort  Skin: Negative for rash.  Neurological: Positive for dizziness. Negative for seizures and headaches.  Psychiatric/Behavioral: Negative for hallucinations.      Allergies  Review of patient's allergies indicates no known allergies.  Home Medications   Prior to Admission medications   Medication Sig Start Date End Date Taking? Authorizing Provider  acetaminophen (TYLENOL) 500 MG tablet Take 1,000 mg by mouth every 6 (six) hours as needed for mild pain.    Historical Provider, MD  cloNIDine (CATAPRES) 0.1 MG tablet Take 0.1 mg by mouth 2 (two) times daily.    Historical Provider, MD  hydrochlorothiazide (HYDRODIURIL) 12.5 MG tablet Take 12.5 mg by mouth daily.    Historical Provider, MD  lisinopril (PRINIVIL,ZESTRIL) 20 MG tablet Take 20 mg by mouth 2 (two) times daily.    Historical Provider, MD  oxyCODONE (OXY IR/ROXICODONE) 5 MG immediate release tablet Take 1 tablet (5 mg total) by mouth every 6 (six) hours as needed for moderate pain. 10/25/15   Annita Brod, MD  Rivaroxaban (XARELTO STARTER PACK) 15 & 20 MG TBPK Take as directed on package: Start with one 15mg  tablet by mouth twice a day with food. On Day 22, switch to one 20mg  tablet once a day with food. 10/25/15   Annita Brod, MD  rivaroxaban (XARELTO) 20 MG TABS  tablet Start after 11/23/14. 20mg  po daily 10/25/15   Annita Brod, MD   BP 194/94 mmHg  Pulse 87  Temp(Src) 98.2 F (36.8 C) (Oral)  Resp 16  Ht 5\' 3"  (1.6 m)  Wt 159 lb (72.122 kg)  BMI 28.17 kg/m2  SpO2 97% Physical Exam  Constitutional: She is oriented to person, place, and time. She appears well-developed.  HENT:  Head: Normocephalic.  Oozing to left temporal area  Eyes: Conjunctivae and EOM are normal. No scleral icterus.  Neck: Neck supple. No thyromegaly present.  Cardiovascular: Normal rate and regular rhythm.  Exam reveals no gallop and no friction rub.   No  murmur heard. Pulmonary/Chest: No stridor. She has no wheezes. She has no rales. She exhibits no tenderness.  Abdominal: She exhibits no distension. There is no tenderness. There is no rebound.  Musculoskeletal: She exhibits no edema.  Mild tenderness to left fifth finger  Lymphadenopathy:    She has no cervical adenopathy.  Neurological: She is oriented to person, place, and time. She exhibits normal muscle tone. Coordination normal.  Skin: No rash noted. No erythema.  Psychiatric: She has a normal mood and affect. Her behavior is normal.    ED Course  Procedures (including critical care time) Labs Review Labs Reviewed  CBC WITH DIFFERENTIAL/PLATELET - Abnormal; Notable for the following:    Hemoglobin 10.4 (*)    HCT 34.8 (*)    MCV 75.7 (*)    MCH 22.6 (*)    MCHC 29.9 (*)    RDW 17.6 (*)    All other components within normal limits  PROTIME-INR - Abnormal; Notable for the following:    Prothrombin Time 18.6 (*)    INR 1.54 (*)    All other components within normal limits  COMPREHENSIVE METABOLIC PANEL - Abnormal; Notable for the following:    Potassium 3.3 (*)    ALT 11 (*)    All other components within normal limits  URINALYSIS, ROUTINE W REFLEX MICROSCOPIC (NOT AT [facility]) - Abnormal; Notable for the following:    Leukocytes, UA TRACE (*)    All other components within normal limits  URINE MICROSCOPIC-ADD ON - Abnormal; Notable for the following:    Squamous Epithelial / LPF 0-5 (*)    Bacteria, UA FEW (*)    All other components within normal limits  Randolm Idol, ED    Imaging Review Dg Chest 2 View  11/23/2015  CLINICAL DATA:  Dizziness with fall. Anterior chest pain following fall. History of breast carcinoma on the left EXAM: CHEST  2 VIEW COMPARISON:  Chest radiograph August 18, 2012 and chest CT October 24, 2015 FINDINGS: There is no edema or consolidation. Heart is upper normal in size with pulmonary vascularity within normal limits. No adenopathy.  Port-A-Cath tip is in the superior vena cava. No pneumothorax. No acute fracture. There is degenerative change in the lower thoracic spine. There is postoperative change in the right shoulder. There are surgical clips in the left breast region. IMPRESSION: No edema or consolidation.  No pneumothorax. Electronically Signed   By: Lowella Grip III M.D.   On: 11/23/2015 17:51   Ct Head Wo Contrast  11/23/2015  CLINICAL DATA:  Fall EXAM: CT HEAD WITHOUT CONTRAST CT CERVICAL SPINE WITHOUT CONTRAST TECHNIQUE: Multidetector CT imaging of the head and cervical spine was performed following the standard protocol without intravenous contrast. Multiplanar CT image reconstructions of the cervical spine were also generated. COMPARISON:  10/24/2015 FINDINGS: CT HEAD FINDINGS There are  chronic ischemic changes in the periventricular white matter. There is no mass effect, midline shift, or acute hemorrhage. Mastoid air cells are clear. Visualized paranasal sinuses are clear. The cranium is intact. CT CERVICAL SPINE FINDINGS There is stable alignment. No acute fracture or dislocation. Posterior osteophytic ridging occurs at C4-5, C5-6, and C6-7 with disc space narrowing. Foraminal narrowing occurs on the left at C5-6 and C6-7 secondary to uncovertebral osteophytes. This occurs to a lesser degree on the right at C5-6. There is an element of spinal stenosis at C4-5, C5-6, and C6-7. There is no obvious soft tissue hemorrhage or injury. Right subclavian venous catheter is in place. Lung apices are grossly clear. IMPRESSION: No acute intracranial pathology. No evidence of acute cervical spine injury. Degenerative changes are noted with spinal stenosis. Electronically Signed   By: Marybelle Killings M.D.   On: 11/23/2015 17:42   Ct Cervical Spine Wo Contrast  11/23/2015  CLINICAL DATA:  Fall EXAM: CT HEAD WITHOUT CONTRAST CT CERVICAL SPINE WITHOUT CONTRAST TECHNIQUE: Multidetector CT imaging of the head and cervical spine was  performed following the standard protocol without intravenous contrast. Multiplanar CT image reconstructions of the cervical spine were also generated. COMPARISON:  10/24/2015 FINDINGS: CT HEAD FINDINGS There are chronic ischemic changes in the periventricular white matter. There is no mass effect, midline shift, or acute hemorrhage. Mastoid air cells are clear. Visualized paranasal sinuses are clear. The cranium is intact. CT CERVICAL SPINE FINDINGS There is stable alignment. No acute fracture or dislocation. Posterior osteophytic ridging occurs at C4-5, C5-6, and C6-7 with disc space narrowing. Foraminal narrowing occurs on the left at C5-6 and C6-7 secondary to uncovertebral osteophytes. This occurs to a lesser degree on the right at C5-6. There is an element of spinal stenosis at C4-5, C5-6, and C6-7. There is no obvious soft tissue hemorrhage or injury. Right subclavian venous catheter is in place. Lung apices are grossly clear. IMPRESSION: No acute intracranial pathology. No evidence of acute cervical spine injury. Degenerative changes are noted with spinal stenosis. Electronically Signed   By: Marybelle Killings M.D.   On: 11/23/2015 17:42   Dg Hand Complete Left  11/23/2015  CLINICAL DATA:  65 year old female with fall and left hand pain. EXAM: LEFT HAND - COMPLETE 3+ VIEW COMPARISON:  None. FINDINGS: There is no acute fracture or dislocation. The bones are osteopenic. Minimal osteoarthritic changes of the base of the thumb noted. There is narrowing of the radiocarpal joint space compatible with chronic changes. The soft tissues appear unremarkable. IMPRESSION: No acute osseous pathology. Electronically Signed   By: Anner Crete M.D.   On: 11/23/2015 18:37   I have personally reviewed and evaluated these images and lab results as part of my medical decision-making.   EKG Interpretation   Date/Time:  Tuesday November 23 2015 15:59:17 EST Ventricular Rate:  105 PR Interval:  146 QRS Duration: 93 QT  Interval:  340 QTC Calculation: 449 R Axis:   49 Text Interpretation:  Sinus tachycardia Probable inferior infarct, age  indeterminate Confirmed by Merdith Boyd  MD, Amenda Duclos [phone]) on 11/23/2015 7:28:16  PM      MDM   Final diagnoses:  Fall, initial encounter    Patient with syncopal episode labs x-rays unremarkable. Patient with contusion to left temporal area and left fifth finger. She will follow-up with her PCP    Milton Ferguson, MD 11/23/15 1929

## 2015-11-23 NOTE — Discharge Instructions (Signed)
Drink plenty of fluids and follow up with your md in a week or sooner if problems

## 2015-12-19 ENCOUNTER — Emergency Department (HOSPITAL_COMMUNITY)
Admission: EM | Admit: 2015-12-19 | Discharge: 2015-12-19 | Disposition: A | Discharge status: Home / Self Care | Payer: Medicare Other | Attending: Emergency Medicine | Admitting: Emergency Medicine

## 2015-12-19 ENCOUNTER — Encounter (HOSPITAL_COMMUNITY): Payer: Self-pay

## 2015-12-19 DIAGNOSIS — I159 Secondary hypertension, unspecified: Secondary | ICD-10-CM

## 2015-12-19 DIAGNOSIS — Z79899 Other long term (current) drug therapy: Secondary | ICD-10-CM | POA: Insufficient documentation

## 2015-12-19 DIAGNOSIS — E785 Hyperlipidemia, unspecified: Secondary | ICD-10-CM | POA: Diagnosis not present

## 2015-12-19 DIAGNOSIS — C50912 Malignant neoplasm of unspecified site of left female breast: Secondary | ICD-10-CM | POA: Diagnosis not present

## 2015-12-19 NOTE — Discharge Instructions (Signed)

## 2015-12-19 NOTE — ED Provider Notes (Signed)
CSN: [id number]     Arrival date & time 12/19/15  1408 History   First MD Initiated Contact with Patient 12/19/15 1429     Chief Complaint  Patient presents with  . Hypertension     (Consider location/radiation/quality/duration/timing/severity/associated sxs/prior Treatment) HPI   Margaret Avila is a 65 y.o. female who presents for evaluation of high blood pressure. Today she was concerned about her blood pressure being elevated, so went to a local fire department, where her blood pressure was checked and found to be high at 200/110. He comes that, she came to the emergency department. She was started on clonidine patch and lisinopril for hypertension, 3 days ago at a outlying ED. She is taking radiation therapy daily, for left breast cancer. She has a follow-up appointment scheduled for tomorrow with cardiology to evaluate her blood pressure. She denies headache, chest pain, weakness or dizziness. There are no other known modifying factors.     Past Medical History  Diagnosis Date  . Hypertension   . Hyperlipidemia   . Acid reflux   . Vertigo   . Hiatal hernia   . Cancer ([place])   . Breast cancer ([place]) Left breast cancer with lymph node removal. Completed Chemo 2016. due to start radiation 11/03/15. Treated by Dr Sherre Lain in [place]   Past Surgical History  Procedure Laterality Date  . Total knee arthroplasty    . Total hip arthroplasty    . Abdominal hysterectomy     Family History  Problem Relation Age of Onset  . Hypertension Mother   . Hypertension Father   . Hypertension Other    Social History  Substance Use Topics  . Smoking status: Never Smoker   . Smokeless tobacco: Never Used  . Alcohol Use: No   OB History    Gravida Para Term Preterm AB TAB SAB Ectopic Multiple Living   3 3 3   0  0   4     Review of Systems  All other systems reviewed and are negative.     Allergies  Review of patient's allergies indicates no known allergies.  Home Medications    Prior to Admission medications   Medication Sig Start Date End Date Taking? Authorizing Provider  acetaminophen (TYLENOL) 500 MG tablet Take 1,000 mg by mouth every 6 (six) hours as needed for mild pain.    Historical Provider, MD  cloNIDine (CATAPRES - DOSED IN MG/24 HR) 0.1 mg/24hr patch Place 1 patch onto the skin once a week. 12/17/15   Historical Provider, MD  cloNIDine (CATAPRES) 0.1 MG tablet Take 0.1 mg by mouth 2 (two) times daily.    Historical Provider, MD  hydrochlorothiazide (HYDRODIURIL) 12.5 MG tablet Take 12.5 mg by mouth daily.    Historical Provider, MD  lisinopril (PRINIVIL,ZESTRIL) 40 MG tablet Take 1 tablet by mouth daily. 12/16/15   Historical Provider, MD  oxyCODONE (OXY IR/ROXICODONE) 5 MG immediate release tablet Take 1 tablet (5 mg total) by mouth every 6 (six) hours as needed for moderate pain. 10/25/15   Annita Brod, MD  Rivaroxaban (XARELTO STARTER PACK) 15 & 20 MG TBPK Take as directed on package: Start with one 15mg  tablet by mouth twice a day with food. On Day 22, switch to one 20mg  tablet once a day with food. 10/25/15   Annita Brod, MD  rivaroxaban (XARELTO) 20 MG TABS tablet Start after 11/23/14. 20mg  po daily 10/25/15   Annita Brod, MD   BP 153/95 mmHg  Pulse 72  Temp(Src) 97.8 F (36.6 C) (Oral)  Resp 18  Ht 5\' 3"  (1.6 m)  Wt 168 lb (76.204 kg)  BMI 29.77 kg/m2  SpO2 100% Physical Exam  Constitutional: She is oriented to person, place, and time. She appears well-developed and well-nourished.  HENT:  Head: Normocephalic and atraumatic.  Right Ear: External ear normal.  Left Ear: External ear normal.  Eyes: Conjunctivae and EOM are normal. Pupils are equal, round, and reactive to light.  Neck: Normal range of motion and phonation normal. Neck supple.  Cardiovascular: Normal rate, regular rhythm and normal heart sounds.   Pulmonary/Chest: Effort normal and breath sounds normal. She exhibits no bony tenderness.  Abdominal: Soft. There is  no tenderness.  Musculoskeletal: Normal range of motion.  Neurological: She is alert and oriented to person, place, and time. No cranial nerve deficit or sensory deficit. She exhibits normal muscle tone. Coordination normal.  Skin: Skin is warm, dry and intact.  Psychiatric: She has a normal mood and affect. Her behavior is normal. Judgment and thought content normal.  Nursing note and vitals reviewed.   ED Course  Procedures (including critical care time) Medications - No data to display  Patient Vitals for the past 24 hrs:  BP Temp Temp src Pulse Resp SpO2 Height Weight  12/19/15 1504 153/95 mmHg - - 72 18 100 % - -  12/19/15 1415 170/82 mmHg - - 79 20 100 % - -  12/19/15 1413 185/78 mmHg 97.8 F (36.6 C) Oral 76 20 100 % 5\' 3"  (1.6 m) 168 lb (76.204 kg)    3:20 PM Reevaluation with update and discussion. After initial assessment and treatment, an updated evaluation reveals she remains comfortable. She now remembers taking cold medicine recently, which may contain a stimulant medication. She cannot recall the name of it. Findings discussed with the patient and all questions were answered.. [place] Review Labs Reviewed - No data to display  Imaging Review No results found. I have personally reviewed and evaluated these images and lab results as part of my medical decision-making.   EKG Interpretation None      MDM   Final diagnoses:  Secondary hypertension, unspecified    Evaluation for concern of hypertension. Patient's blood pressure improved spontaneously, when she rested, in the supine position. Doubt unstable cardiovascular condition, hypertensive urgency, ACS, metabolic instability or serious bacterial infection.  Nursing Notes Reviewed/ Care Coordinated Applicable Imaging Reviewed Interpretation of Laboratory Data incorporated into ED treatment  The patient appears reasonably screened and/or stabilized for discharge and I doubt any other medical  condition or other [facility] requiring further screening, evaluation, or treatment in the ED at this time prior to discharge.  Plan: Home Medications- usual; Home Treatments- rest; return here if the recommended treatment, does not improve the symptoms; Recommended follow up- PCP prn     Daleen Bo, MD 12/19/15 [phone]

## 2015-12-19 NOTE — ED Notes (Signed)
Pt reports she has been taking some over the counter cold medication for about 2 weeks.  Has been taking it twice daily.  Is not sure what type it is.

## 2015-12-19 NOTE — ED Notes (Signed)
Pt states she was at her PCP on Friday in [place] and was sent to the local ER for evaluation of hypertension. States she was put on a clonidine patch and lisinopril. Pt states she has been taking her medication but her blood pressure is still elevated

## 2016-08-21 ENCOUNTER — Encounter (HOSPITAL_COMMUNITY): Payer: Self-pay

## 2016-08-21 ENCOUNTER — Emergency Department (HOSPITAL_COMMUNITY)
Admission: EM | Admit: 2016-08-21 | Discharge: 2016-08-21 | Disposition: A | Discharge status: Home / Self Care | Payer: Medicare Other | Attending: Emergency Medicine | Admitting: Emergency Medicine

## 2016-08-21 DIAGNOSIS — N939 Abnormal uterine and vaginal bleeding, unspecified: Secondary | ICD-10-CM | POA: Diagnosis present

## 2016-08-21 DIAGNOSIS — Z79899 Other long term (current) drug therapy: Secondary | ICD-10-CM | POA: Insufficient documentation

## 2016-08-21 DIAGNOSIS — Z853 Personal history of malignant neoplasm of breast: Secondary | ICD-10-CM | POA: Diagnosis not present

## 2016-08-21 DIAGNOSIS — I10 Essential (primary) hypertension: Secondary | ICD-10-CM | POA: Insufficient documentation

## 2016-08-21 LAB — CBC WITH DIFFERENTIAL/PLATELET
BASOS ABS: 0 10*3/uL (ref 0.0–0.1)
Basophils Relative: 0 %
EOS ABS: 0.1 10*3/uL (ref 0.0–0.7)
Eosinophils Relative: 1 %
HCT: 37.2 % (ref 36.0–46.0)
HEMOGLOBIN: 11.5 g/dL — AB (ref 12.0–15.0)
LYMPHS ABS: 1.5 10*3/uL (ref 0.7–4.0)
Lymphocytes Relative: 25 %
MCH: 24.4 pg — AB (ref 26.0–34.0)
MCHC: 30.9 g/dL (ref 30.0–36.0)
MCV: 79 fL (ref 78.0–100.0)
Monocytes Absolute: 0.4 10*3/uL (ref 0.1–1.0)
Monocytes Relative: 7 %
NEUTROS PCT: 67 %
Neutro Abs: 4 10*3/uL (ref 1.7–7.7)
Platelets: 262 10*3/uL (ref 150–400)
RBC: 4.71 MIL/uL (ref 3.87–5.11)
RDW: 15.3 % (ref 11.5–15.5)
WBC: 6 10*3/uL (ref 4.0–10.5)

## 2016-08-21 NOTE — Discharge Instructions (Signed)
Follow-up with Dr. Glo Herring in the GYN office in the next 2-3 days. His contact information has been provided in this discharge summary for you to call and make these arrangements.  Return to the emergency department if your bleeding significantly worsens, you develop severe pain, fever, or other new and concerning symptoms.

## 2016-08-21 NOTE — ED Provider Notes (Signed)
[facility] DEPT Provider Note   CSN: [id number] Arrival date & time: 08/21/16  [id number]  By signing my name below, I, Higinio Plan, attest that this documentation has been prepared under the direction and in the presence of Veryl Speak, MD . Electronically Signed: Higinio Plan, Scribe. 08/21/2016. 10:31 AM.  History   Chief Complaint Chief Complaint  Patient presents with  . Vaginal Bleeding   The history is provided by the patient. No language interpreter was used.   HPI Comments: Margaret Avila is a 65 y.o. female who presents to the Emergency Department complaining of intermittent, vaginal bleeding that began 1 week ago. Pt reports her "commode was full of blood" this morning after urinating which she why she visited the ED. She notes this is unusual for her as she had a hysterectomy 6 years ago and has not experienced any vaginal bleeding until 1 week ago. She states she has already visited [facility] and [place] ED for similar symptoms this past week and was told her bleeding was due to a side effect of Xarelto. Pt reports she was told to make an appointment at [facility] but she has not called them yet. She notes PSHx of hysterectomy 6 years ago with no complications and states she has not experienced any vaginal bleeding until 1 week ago. She denies rectal bleeding, abdominal pain, back pain, recent injury, hx of hemorrhoids.   Past Medical History:  Diagnosis Date  . Acid reflux   . Breast cancer ([place]) Left breast cancer with lymph node removal. Completed Chemo 2016. due to start radiation 11/03/15. Treated by Dr Sherre Lain in [place]  . Cancer ([place])   . Hiatal hernia   . Hyperlipidemia   . Hypertension   . Vertigo     Patient Active Problem List   Diagnosis Date Noted  . Bilateral pulmonary embolism ([place]) 10/24/2015  . Hypertension 10/24/2015  . Breast cancer ([place]) 10/24/2015  . Multiple pulmonary emboli ([place]) 10/24/2015    Past Surgical History:  Procedure Laterality Date    . ABDOMINAL HYSTERECTOMY    . TOTAL HIP ARTHROPLASTY    . TOTAL KNEE ARTHROPLASTY      OB History    Gravida Para Term Preterm AB Living   3 3 3    0 4   SAB TAB Ectopic Multiple Live Births   0               Home Medications    Prior to Admission medications   Medication Sig Start Date End Date Taking? Authorizing Provider  acetaminophen (TYLENOL) 500 MG tablet Take 1,000 mg by mouth every 6 (six) hours as needed for mild pain.    Historical Provider, MD  cloNIDine (CATAPRES - DOSED IN MG/24 HR) 0.1 mg/24hr patch Place 1 patch onto the skin once a week. 12/17/15   Historical Provider, MD  cloNIDine (CATAPRES) 0.1 MG tablet Take 0.1 mg by mouth 2 (two) times daily.    Historical Provider, MD  hydrochlorothiazide (HYDRODIURIL) 12.5 MG tablet Take 12.5 mg by mouth daily.    Historical Provider, MD  lisinopril (PRINIVIL,ZESTRIL) 40 MG tablet Take 1 tablet by mouth daily. 12/16/15   Historical Provider, MD  oxyCODONE (OXY IR/ROXICODONE) 5 MG immediate release tablet Take 1 tablet (5 mg total) by mouth every 6 (six) hours as needed for moderate pain. 10/25/15   Annita Brod, MD  Rivaroxaban (XARELTO STARTER PACK) 15 & 20 MG TBPK Take as directed on package: Start with one 15mg  tablet by mouth  twice a day with food. On Day 22, switch to one 20mg  tablet once a day with food. 10/25/15   Annita Brod, MD  rivaroxaban (XARELTO) 20 MG TABS tablet Start after 11/23/14. 20mg  po daily 10/25/15   Annita Brod, MD    Family History Family History  Problem Relation Age of Onset  . Hypertension Mother   . Hypertension Father   . Hypertension Other     Social History Social History  Substance Use Topics  . Smoking status: Never Smoker  . Smokeless tobacco: Never Used  . Alcohol use No     Allergies   Patient has no known allergies.   Review of Systems Review of Systems  Gastrointestinal: Negative for abdominal pain and anal bleeding.  Genitourinary: Positive for vaginal  bleeding.  Musculoskeletal: Negative for back pain.   Physical Exam Updated Vital Signs BP 170/88 (BP Location: Left Arm)   Pulse 95   Temp 98.1 F (36.7 C) (Oral)   Resp 18   Ht 5\' 3"  (1.6 m)   Wt 175 lb (79.4 kg)   SpO2 95%   BMI 31.00 kg/m   Physical Exam  Constitutional: She is oriented to person, place, and time. She appears well-developed and well-nourished. No distress.  HENT:  Head: Normocephalic and atraumatic.  Eyes: EOM are normal.  Neck: Normal range of motion.  Cardiovascular: Normal rate, regular rhythm and normal heart sounds.   Pulmonary/Chest: Effort normal and breath sounds normal.  Abdominal: Soft. She exhibits no distension. There is no tenderness.  Musculoskeletal: Normal range of motion.  Neurological: She is alert and oriented to person, place, and time.  Skin: Skin is warm and dry.  Psychiatric: She has a normal mood and affect. Judgment normal.  Nursing note and vitals reviewed.  ED Treatments / Results  Labs (all labs ordered are listed, but only abnormal results are displayed) Labs Reviewed - No data to display  EKG  EKG Interpretation None       Radiology No results found.  Procedures Procedures (including critical care time)  Medications Ordered in ED Medications - No data to display  DIAGNOSTIC STUDIES:  Oxygen Saturation is 95% on RA, adequuate by my interpretation.    COORDINATION OF CARE:  10:26 AM Discussed treatment plan with pt at bedside and pt agreed to plan.  Initial Impression / Assessment and Plan / ED Course  I have reviewed the triage vital signs and the nursing notes.  Pertinent labs & imaging results that were available during my care of the patient were reviewed by me and considered in my medical decision making (see chart for details).  Clinical Course     Patient is 6 years status post hysterectomy. She was recently started on Xarelto for blood clots. She presents today complaining of bleeding from her  vagina. She has been seen by 2 other ERs, however no explanation was given. Her hemoglobin today is stable and she is hemodynamically stable as well. Speculum exam reveals some blood in the vaginal vault, however no obvious source. I see no lesions, masses, or other abnormality.  Her hysterectomy was performed by Dr. Glo Herring and I will advise her to follow-up with him in the near future.  I personally performed the services described in this documentation, which was scribed in my presence. The recorded information has been reviewed and is accurate.   Final Clinical Impressions(s) / ED Diagnoses   Final diagnoses:  None    New Prescriptions New Prescriptions  No medications on file     Veryl Speak, MD 08/21/16 1127

## 2016-08-21 NOTE — ED Triage Notes (Signed)
Pt states she has had vaginal bleeding over a week. States she was at [facility] Wednesday and [facility] Saturday and was told at both places it is related to a blood thinner she is on. Pt states the blood is light in color.

## 2016-08-30 ENCOUNTER — Ambulatory Visit (INDEPENDENT_AMBULATORY_CARE_PROVIDER_SITE_OTHER): Payer: Medicare Other | Admitting: Obstetrics and Gynecology

## 2016-08-30 ENCOUNTER — Encounter: Payer: Self-pay | Admitting: Obstetrics and Gynecology

## 2016-08-30 VITALS — BP 162/90 | HR 80 | Ht 63.0 in | Wt 179.0 lb

## 2016-08-30 DIAGNOSIS — N3946 Mixed incontinence: Secondary | ICD-10-CM | POA: Diagnosis not present

## 2016-08-30 DIAGNOSIS — N950 Postmenopausal bleeding: Secondary | ICD-10-CM

## 2016-08-30 DIAGNOSIS — R32 Unspecified urinary incontinence: Secondary | ICD-10-CM

## 2016-08-30 DIAGNOSIS — N393 Stress incontinence (female) (male): Secondary | ICD-10-CM

## 2016-08-30 LAB — POCT URINALYSIS DIPSTICK
GLUCOSE UA: NEGATIVE
KETONES UA: NEGATIVE
NITRITE UA: NEGATIVE
Protein, UA: NEGATIVE
RBC UA: NEGATIVE

## 2016-08-30 MED ORDER — OXYBUTYNIN CHLORIDE 5 MG PO TABS: 5.0000 mg | ORAL_TABLET | Freq: Two times a day (BID) | ORAL | 3 refills | Status: DC

## 2016-08-30 NOTE — Progress Notes (Addendum)
Patient ID: ANALICE HOLTZ, female   DOB: 12/20/1950, 65 y.o.   MRN: [id number]   [facility] Visit  @DATE @            Patient name: Margaret Avila MRN [id number]  Date of birth: April 30, 1951  CC & HPI:   Chief Complaint  Patient presents with  . Urinary Incontinence    trouble holding urine, when coughs looses control of bladder  . Vaginal Bleeding    three times last week had a lot of vaginal bleeding     Margaret Avila is a 65 y.o. female presenting today for intermittent vaginal bleeding for 2 weeks. Pt has been taking Xarelto since 10/24/15 d/t h/o multiple PE/DVT. She also complains of SUI, which is worsened with coughing, and urge incontinence. She reports that she wears adult briefs at night, which she reports are wet in the morning. She denies frequent bladder infections.   PCP is with [facility]. Cardiologist is Dr. Manuella Ghazi in [place]. Pt states she sees her cardiologist q3 months and [facility] less often. Pt has h/o stage 2 left breast cancer with lymph node removal and completed chemo in 2016. She was also treated with radiation therapy in the beginning of this year.   ROS:  ROS +vaginal bleeding  +SUI, urge incontinence   Pertinent History Reviewed:   Reviewed: Significant for HTN, breast cancer Medical         Past Medical History:  Diagnosis Date  . Acid reflux   . Breast cancer ([place]) Left breast cancer with lymph node removal. Completed Chemo 2016. due to start radiation 11/03/15. Treated by Dr Sherre Lain in [place]  . Cancer ([place])   . Hiatal hernia   . Hyperlipidemia   . Hypertension   . Vertigo                               Surgical Hx:    Past Surgical History:  Procedure Laterality Date  . ABDOMINAL HYSTERECTOMY    . TOTAL HIP ARTHROPLASTY    . TOTAL KNEE ARTHROPLASTY     Medications: Reviewed & Updated - see associated section                       Current Outpatient Prescriptions:  .  amLODipine (NORVASC) 5 MG tablet, 1 tablet daily.,  Disp: , Rfl:  .  cloNIDine (CATAPRES) 0.1 MG tablet, Take 0.2 mg by mouth 2 (two) times daily. , Disp: , Rfl:  .  hydrochlorothiazide (HYDRODIURIL) 12.5 MG tablet, Take 25 mg by mouth daily. , Disp: , Rfl:  .  olmesartan (BENICAR) 40 MG tablet, 1 tablet daily., Disp: , Rfl:  .  rivaroxaban (XARELTO) 20 MG TABS tablet, Start after 11/23/14. 20mg  po daily (Patient taking differently: 20 mg daily. ), Disp: 30 tablet, Rfl: 4   Social History: Reviewed -  reports that she has never smoked. She has never used smokeless tobacco.  Objective Findings:  Vitals: Blood pressure (!) 162/90, pulse 80, height 5\' 3"  (1.6 m), weight 179 lb (81.2 kg).  Physical Examination: General appearance - alert, well appearing, and in no distress Mental status - alert, oriented to person, place, and time Abdomen - soft, nontender, nondistended, no masses or organomegaly Pelvic -  VULVA: normal appearing vulva with no masses, tenderness or lesions,  VAGINA: normal appearing vagina with normal color and discharge, no lesions, atrophic, good support, slight  bladder rotation with coughing  Cuff present, atrophic vag tissues.(prior documentation incorrect) ADNEXA: normal adnexa in size, nontender and no masses   Assessment & Plan:   A:  1. Urge incontinence and SUI 2. PMB on Xarelto  3 atrophic vaginitis  P:  1. Will rx trial of 5 mg bid Ditropan  2. Pelvic US  3 cannot use topical estrogens due to Hx PE and BR CAncer.   By signing my name below, I, Hansel Feinstein, attest that this documentation has been prepared under the direction and in the presence of Jonnie Kind, MD. Electronically Signed: Hansel Feinstein, ED Scribe. 08/30/16. 9:05 AM.  I personally performed the services described in this documentation, which was SCRIBED in my presence. The recorded information has been reviewed and considered accurate. It has been edited as necessary during review. Jonnie Kind, MD

## 2016-09-12 ENCOUNTER — Other Ambulatory Visit: Payer: Self-pay | Admitting: Obstetrics and Gynecology

## 2016-09-12 DIAGNOSIS — N950 Postmenopausal bleeding: Secondary | ICD-10-CM

## 2016-09-13 ENCOUNTER — Encounter: Payer: Self-pay | Admitting: Obstetrics and Gynecology

## 2016-09-13 ENCOUNTER — Ambulatory Visit (INDEPENDENT_AMBULATORY_CARE_PROVIDER_SITE_OTHER): Payer: Medicare Other

## 2016-09-13 ENCOUNTER — Emergency Department (HOSPITAL_COMMUNITY)
Admission: EM | Admit: 2016-09-13 | Discharge: 2016-09-13 | Disposition: A | Discharge status: Home / Self Care | Payer: Medicare Other | Attending: Emergency Medicine | Admitting: Emergency Medicine

## 2016-09-13 ENCOUNTER — Encounter (HOSPITAL_COMMUNITY): Payer: Self-pay | Admitting: Emergency Medicine

## 2016-09-13 ENCOUNTER — Other Ambulatory Visit: Payer: Self-pay | Admitting: Obstetrics and Gynecology

## 2016-09-13 ENCOUNTER — Ambulatory Visit (INDEPENDENT_AMBULATORY_CARE_PROVIDER_SITE_OTHER): Payer: Medicare Other | Admitting: Obstetrics and Gynecology

## 2016-09-13 VITALS — BP 240/120 | HR 90 | Wt 178.4 lb

## 2016-09-13 DIAGNOSIS — I159 Secondary hypertension, unspecified: Secondary | ICD-10-CM

## 2016-09-13 DIAGNOSIS — N950 Postmenopausal bleeding: Secondary | ICD-10-CM

## 2016-09-13 DIAGNOSIS — N952 Postmenopausal atrophic vaginitis: Secondary | ICD-10-CM | POA: Diagnosis not present

## 2016-09-13 DIAGNOSIS — Z853 Personal history of malignant neoplasm of breast: Secondary | ICD-10-CM | POA: Diagnosis not present

## 2016-09-13 DIAGNOSIS — I10 Essential (primary) hypertension: Secondary | ICD-10-CM | POA: Diagnosis not present

## 2016-09-13 DIAGNOSIS — Z7901 Long term (current) use of anticoagulants: Secondary | ICD-10-CM | POA: Insufficient documentation

## 2016-09-13 DIAGNOSIS — N3946 Mixed incontinence: Secondary | ICD-10-CM

## 2016-09-13 MED ORDER — AMLODIPINE BESYLATE 5 MG PO TABS: 10.0000 mg | ORAL_TABLET | Freq: Every day | ORAL | 0 refills | Status: DC

## 2016-09-13 NOTE — Progress Notes (Signed)
[facility] Visit  09/13/2016          Patient name: Margaret Avila MRN [id number]  Date of birth: 1951/01/06  CC & HPI:  Margaret Avila is a 65 y.o. female presenting today for follow up concerning urinary incontinence and post menopausal bleeding. She states the bleeding has resolved, however notes she is on blood thinners. Pt has been experiencing both stress incontinence and urge incontinence. She was placed on oxybutynin during her last visit which provided no improvement. She is also currently taking HCTZ and states she frequently has to get up to urinate after she goes to bed.   Pt presents today with elevated BP. She has not seen PCP in a while to follow up about her HTN. She is also taking Clonidine for her BP. States she is compliant with all daily meds.   ROS:  ROS Otherwise negative for acute change except as noted in the HPI.  Pertinent History Reviewed:   Reviewed: Significant for HTN,  Medical         Past Medical History:  Diagnosis Date  . Acid reflux   . Breast cancer ([place]) Left breast cancer with lymph node removal. Completed Chemo 2016. due to start radiation 11/03/15. Treated by Dr Sherre Lain in [place]  . Cancer ([place])   . Hiatal hernia   . Hyperlipidemia   . Hypertension   . Vertigo                               Surgical Hx:    Past Surgical History:  Procedure Laterality Date  . ABDOMINAL HYSTERECTOMY    . TOTAL HIP ARTHROPLASTY    . TOTAL KNEE ARTHROPLASTY     Medications: Reviewed & Updated - see associated section                       Current Outpatient Prescriptions:  .  amLODipine (NORVASC) 5 MG tablet, 1 tablet daily., Disp: , Rfl:  .  cloNIDine (CATAPRES) 0.1 MG tablet, Take 0.2 mg by mouth 2 (two) times daily. , Disp: , Rfl:  .  hydrochlorothiazide (HYDRODIURIL) 12.5 MG tablet, Take 25 mg by mouth daily. , Disp: , Rfl:  .  olmesartan (BENICAR) 40 MG tablet, 1 tablet daily., Disp: , Rfl:  .  oxybutynin (DITROPAN) 5 MG tablet, Take 1  tablet (5 mg total) by mouth 2 (two) times daily., Disp: 60 tablet, Rfl: 3 .  rivaroxaban (XARELTO) 20 MG TABS tablet, Start after 11/23/14. 20mg  po daily (Patient taking differently: 20 mg daily. ), Disp: 30 tablet, Rfl: 4   Social History: Reviewed -  reports that she has never smoked. She has never used smokeless tobacco.  Objective Findings:  Vitals: Blood pressure (!) 240/120, pulse 90, weight 178 lb 6.4 oz (80.9 kg).  Physical Examination: General appearance - alert, well appearing, and in no distress Mental status - alert, oriented to person, place, and time Pelvic - examination not indicated  10:33 AM Repeat BP: 212/118 on the right and 240/122 on the left   10:38 AM Discussed case with Cardiologist who will see pt in office at 11:45 AM.   Assessment & Plan:   A:  1. Uncontrolled HTN, severe range. 2. Urge incontinecne unresponsive to ditropan  3. Atrophic vaginitis source of bleeding, resolved  P:  1. Discountinue oxybutynin 2. Continue BP meds at present until seen by cardiologist, has  1145 AM appointment today.  The provider spent more than 45 minutes with the visit, including previsit review, documentation and exam, with >50% was spent in counseling and coordination of care.    By signing my name below, I, Evelene Croon, attest that this documentation has been prepared under the direction and in the presence of Jonnie Kind, MD . Electronically Signed: Evelene Croon, Scribe. 09/13/2016. 10:45 AM. I personally performed the services described in this documentation, which was SCRIBED in my presence. The recorded information has been reviewed and considered accurate. It has been edited as necessary during review. Jonnie Kind, MD

## 2016-09-13 NOTE — Progress Notes (Signed)
PELVIC US TA/TV: normal vag cuff,unable to visualize ovaries,no adnexal masses seen,no free fluid,pelvic pain during ultrasound

## 2016-09-13 NOTE — ED Triage Notes (Signed)
Seen at [facility] OB this am and BP 240/120 at office.  Current BP 230/118.  Pt denies any headache, dizziness and pain.  Pt says she took all her medications at 6 am.  Currently on Amlodipine 5 mg, Clonidine 0.1 mg and HCTZ 12.5 mg.

## 2016-09-13 NOTE — ED Provider Notes (Signed)
[facility] DEPT Provider Note   CSN: [id number] Arrival date & time: 09/13/16  1051     History   Chief Complaint Chief Complaint  Patient presents with  . Hypertension    HPI REBEKA LEVON is a 65 y.o. female.   Hypertension  This is a chronic problem. The current episode started more than 1 week ago. The problem occurs constantly. The problem has not changed since onset.Pertinent negatives include no chest pain, no abdominal pain, no headaches and no shortness of breath. Nothing aggravates the symptoms. Nothing relieves the symptoms. She has tried nothing for the symptoms. The treatment provided no relief.    Past Medical History:  Diagnosis Date  . Acid reflux   . Breast cancer () Left breast cancer with lymph node removal. Completed Chemo 2016. due to start radiation 11/03/15. Treated by Dr Sherre Lain in [place]  . Cancer ([place])   . Hiatal hernia   . Hyperlipidemia   . Hypertension   . Vertigo     Patient Active Problem List   Diagnosis Date Noted  . Bilateral pulmonary embolism ([place]) 10/24/2015  . Hypertension 10/24/2015  . Breast cancer ([place]) 10/24/2015  . Multiple pulmonary emboli ([place]) 10/24/2015    Past Surgical History:  Procedure Laterality Date  . ABDOMINAL HYSTERECTOMY    . TOTAL HIP ARTHROPLASTY    . TOTAL KNEE ARTHROPLASTY      OB History    Gravida Para Term Preterm AB Living   3 3 3    0 4   SAB TAB Ectopic Multiple Live Births   0               Home Medications    Prior to Admission medications   Medication Sig Start Date End Date Taking? Authorizing Provider  amLODipine (NORVASC) 5 MG tablet Take 2 tablets (10 mg total) by mouth daily. 09/13/16   Merrily Pew, MD  cloNIDine (CATAPRES) 0.1 MG tablet Take 0.2 mg by mouth 2 (two) times daily.     Historical Provider, MD  hydrochlorothiazide (HYDRODIURIL) 12.5 MG tablet Take 25 mg by mouth daily.     Historical Provider, MD  olmesartan (BENICAR) 40 MG tablet 1 tablet daily. 08/14/16    Historical Provider, MD  oxybutynin (DITROPAN) 5 MG tablet Take 1 tablet (5 mg total) by mouth 2 (two) times daily. 08/30/16   Jonnie Kind, MD  rivaroxaban (XARELTO) 20 MG TABS tablet Start after 11/23/14. 20mg  po daily Patient taking differently: 20 mg daily.  10/25/15   Annita Brod, MD    Family History Family History  Problem Relation Age of Onset  . Hypertension Mother   . Hypertension Father   . Hypertension Other     Social History Social History  Substance Use Topics  . Smoking status: Never Smoker  . Smokeless tobacco: Never Used  . Alcohol use No     Allergies   Patient has no known allergies.   Review of Systems Review of Systems  Constitutional: Negative for fatigue and fever.  Respiratory: Negative for shortness of breath.   Cardiovascular: Negative for chest pain and leg swelling.  Gastrointestinal: Negative for abdominal pain and nausea.  Neurological: Negative for headaches.  All other systems reviewed and are negative.    Physical Exam Updated Vital Signs BP 194/98   Pulse 76   Temp 98 F (36.7 C) (Oral)   Resp 16   Ht 5\' 3"  (1.6 m)   Wt 178 lb 6.4 oz (80.9 kg)  SpO2 100%   BMI 31.60 kg/m   Physical Exam  Constitutional: She is oriented to person, place, and time. She appears well-developed and well-nourished.  HENT:  Head: Normocephalic and atraumatic.  Eyes: Conjunctivae and EOM are normal.  Neck: Normal range of motion.  Cardiovascular: Normal rate and regular rhythm.   No murmur heard. Pulmonary/Chest: No stridor. No respiratory distress.  Abdominal: Soft. She exhibits no distension.  Musculoskeletal: Normal range of motion. She exhibits no edema or deformity.  Neurological: She is alert and oriented to person, place, and time. No cranial nerve deficit.  Skin: Skin is warm and dry. No erythema. No pallor.  Nursing note and vitals reviewed.    ED Treatments / Results  Labs (all labs ordered are listed, but only abnormal  results are displayed) Labs Reviewed - No data to display  EKG  EKG Interpretation None       Radiology No results found.  Procedures Procedures (including critical care time)  Medications Ordered in ED Medications - No data to display   Initial Impression / Assessment and Plan / ED Course  I have reviewed the triage vital signs and the nursing notes.  Pertinent labs & imaging results that were available during my care of the patient were reviewed by me and considered in my medical decision making (see chart for details).  Clinical Course     This is a 65 yo F who presents with asymptomatic hypertension. They have had blood pressures as high as 240. Not had any headache, chest pain, shortness of breath, abdominal pain, back pain lower extremity edema or changes in urinary frequency beyond normal. Has been taking blood pressure medications as directed by the primary doctor. Exam does not show any evidence of neurologic changes, fluid overload, vision changes or other acute issues secondary to hypertension. As ACEP guidelines recommend not acutely lowering BP and having close PCP follow-up for asymptomatic hypertension I suggested that they keep a log of their blood pressures for the next 5-7 days and follow-up with her cardiologist for further management, who I tried to speak with but they were too busy. 2/2 business of cardiologist I will double norvasc in an attempt to get some improvement. They know to return to the emergency department for any onset of the symptoms as listed above.   Final Clinical Impressions(s) / ED Diagnoses   Final diagnoses:  Secondary hypertension    New Prescriptions Current Discharge Medication List       Merrily Pew, MD 09/13/16 1246

## 2017-03-18 ENCOUNTER — Encounter (HOSPITAL_COMMUNITY): Payer: Self-pay | Admitting: Cardiology

## 2017-03-18 ENCOUNTER — Emergency Department (HOSPITAL_COMMUNITY)
Admission: EM | Admit: 2017-03-18 | Discharge: 2017-03-18 | Disposition: A | Discharge status: Home / Self Care | Payer: Medicare Other | Attending: Emergency Medicine | Admitting: Emergency Medicine

## 2017-03-18 ENCOUNTER — Emergency Department (HOSPITAL_COMMUNITY): Payer: Medicare Other

## 2017-03-18 DIAGNOSIS — R0602 Shortness of breath: Secondary | ICD-10-CM | POA: Diagnosis not present

## 2017-03-18 DIAGNOSIS — I10 Essential (primary) hypertension: Secondary | ICD-10-CM | POA: Insufficient documentation

## 2017-03-18 DIAGNOSIS — R0600 Dyspnea, unspecified: Secondary | ICD-10-CM | POA: Insufficient documentation

## 2017-03-18 DIAGNOSIS — R600 Localized edema: Secondary | ICD-10-CM | POA: Insufficient documentation

## 2017-03-18 DIAGNOSIS — Z7901 Long term (current) use of anticoagulants: Secondary | ICD-10-CM | POA: Diagnosis not present

## 2017-03-18 DIAGNOSIS — Z87898 Personal history of other specified conditions: Secondary | ICD-10-CM

## 2017-03-18 DIAGNOSIS — R609 Edema, unspecified: Secondary | ICD-10-CM

## 2017-03-18 DIAGNOSIS — Z79899 Other long term (current) drug therapy: Secondary | ICD-10-CM | POA: Insufficient documentation

## 2017-03-18 DIAGNOSIS — R42 Dizziness and giddiness: Secondary | ICD-10-CM

## 2017-03-18 DIAGNOSIS — Z8709 Personal history of other diseases of the respiratory system: Secondary | ICD-10-CM

## 2017-03-18 DIAGNOSIS — E876 Hypokalemia: Secondary | ICD-10-CM

## 2017-03-18 DIAGNOSIS — Z853 Personal history of malignant neoplasm of breast: Secondary | ICD-10-CM | POA: Insufficient documentation

## 2017-03-18 HISTORY — DX: Other pulmonary embolism without acute cor pulmonale: I26.99

## 2017-03-18 HISTORY — DX: Dyspnea, unspecified: R06.00

## 2017-03-18 HISTORY — DX: Other forms of dyspnea: R06.09

## 2017-03-18 LAB — BASIC METABOLIC PANEL
Anion gap: 10 (ref 5–15)
BUN: 18 mg/dL (ref 6–20)
CALCIUM: 9.2 mg/dL (ref 8.9–10.3)
CHLORIDE: 104 mmol/L (ref 101–111)
CO2: 28 mmol/L (ref 22–32)
CREATININE: 0.89 mg/dL (ref 0.44–1.00)
GFR calc Af Amer: >60 mL/min (ref >60)
GFR calc non Af Amer: >60 mL/min (ref >60)
GLUCOSE: 107 mg/dL — AB (ref 65–99)
Potassium: 2.7 mmol/L — CL (ref 3.5–5.1)
Sodium: 142 mmol/L (ref 135–145)

## 2017-03-18 LAB — URINALYSIS, ROUTINE W REFLEX MICROSCOPIC
BACTERIA UA: NONE SEEN
BILIRUBIN URINE: NEGATIVE
Glucose, UA: NEGATIVE mg/dL
KETONES UR: NEGATIVE mg/dL
LEUKOCYTES UA: NEGATIVE
Nitrite: NEGATIVE
Protein, ur: NEGATIVE mg/dL
SPECIFIC GRAVITY, URINE: 1.014 (ref 1.005–1.030)
pH: 5 (ref 5.0–8.0)

## 2017-03-18 LAB — CBC WITH DIFFERENTIAL/PLATELET
BASOS ABS: 0 10*3/uL (ref 0.0–0.1)
Basophils Relative: 1 %
EOS PCT: 2 %
Eosinophils Absolute: 0.1 10*3/uL (ref 0.0–0.7)
HEMATOCRIT: 34.2 % — AB (ref 36.0–46.0)
Hemoglobin: 10.7 g/dL — ABNORMAL LOW (ref 12.0–15.0)
LYMPHS ABS: 1.5 10*3/uL (ref 0.7–4.0)
LYMPHS PCT: 22 %
MCH: 24.2 pg — AB (ref 26.0–34.0)
MCHC: 31.3 g/dL (ref 30.0–36.0)
MCV: 77.4 fL — AB (ref 78.0–100.0)
MONO ABS: 0.7 10*3/uL (ref 0.1–1.0)
MONOS PCT: 12 %
NEUTROS ABS: 4.1 10*3/uL (ref 1.7–7.7)
Neutrophils Relative %: 63 %
Platelets: 270 10*3/uL (ref 150–400)
RBC: 4.42 MIL/uL (ref 3.87–5.11)
RDW: 15.4 % (ref 11.5–15.5)
WBC: 6.5 10*3/uL (ref 4.0–10.5)

## 2017-03-18 LAB — TROPONIN I: Troponin I: <0.03 ng/mL (ref <0.03)

## 2017-03-18 LAB — BRAIN NATRIURETIC PEPTIDE: B Natriuretic Peptide: 60 pg/mL (ref 0.0–100.0)

## 2017-03-18 MED ORDER — POTASSIUM CHLORIDE CRYS ER 20 MEQ PO TBCR
40.0000 meq | EXTENDED_RELEASE_TABLET | Freq: Once | ORAL | Status: AC
  Administered 2017-03-18: 40 meq via ORAL
  Filled 2017-03-18: qty 2

## 2017-03-18 NOTE — ED Triage Notes (Signed)
On and off sob and ankle edema times one month. Echo at [facility] one month ago

## 2017-03-18 NOTE — Discharge Instructions (Addendum)
Take your usual prescriptions as previously directed.  Elevate your legs as much as possible. Carefully monitor your salt intake. Call your regular medical doctor and your Cardiologist tomorrow to schedule a follow up appointment within the next 3 days. Return to the Emergency Department immediately sooner if worsening.

## 2017-03-18 NOTE — ED Notes (Signed)
Pt ambulatory to bathroom without difficulty.  Denies any dizziness.

## 2017-03-18 NOTE — ED Notes (Signed)
Pt ambulatory to bathroom.  Pulse ox 100 % .  Denied any sob.

## 2017-03-18 NOTE — ED Notes (Signed)
CRITICAL VALUE ALERT  Critical Value:  K 2.7  Date & Time Notied:  03/1017 at 1440  Provider Notified: Dr. Thurnell Garbe   Orders Received/Actions taken:

## 2017-03-18 NOTE — ED Provider Notes (Signed)
[facility] DEPT Provider Note   CSN: [id number] Arrival date & time: 03/18/17  1331     History   Chief Complaint Chief Complaint  Patient presents with  . Shortness of Breath    HPI Margaret Avila is a 66 y.o. female.   Shortness of Breath     Pt was seen at 1345. Per pt, c/o gradual onset and persistence of constant "ankles swelling" for the past several months. Has been associated with SOB. Pt states she has been evaluated by her Cardiologist in [facility] for these symptoms and had an echocardiogram completed (does not know results). Denies any change in her usual SOB and ankles swelling. States she had one brief episode of lightheadedness while sitting in a chair PTA, so she came to the ED for evaluation. Denies CP/palpitations, no cough, no back pain, no abd pain, no N/V/D, no fevers, no rash. Endorses compliance with xarelto.    Past Medical History:  Diagnosis Date  . Acid reflux   . Breast cancer ([place]) Left breast cancer with lymph node removal. Completed Chemo 2016. due to start radiation 11/03/15. Treated by Dr Sherre Lain in [place]  . Cancer ([place])   . DOE (dyspnea on exertion)    chronic  . Dyspnea    chronic  . Hiatal hernia   . Hyperlipidemia   . Hypertension   . PE (pulmonary thromboembolism) ([place])   . Vertigo     Patient Active Problem List   Diagnosis Date Noted  . Bilateral pulmonary embolism ([place]) 10/24/2015  . Hypertension 10/24/2015  . Breast cancer ([place]) 10/24/2015  . Multiple pulmonary emboli ([place]) 10/24/2015    Past Surgical History:  Procedure Laterality Date  . ABDOMINAL HYSTERECTOMY    . TOTAL HIP ARTHROPLASTY    . TOTAL KNEE ARTHROPLASTY      OB History    Gravida Para Term Preterm AB Living   3 3 3    0 4   SAB TAB Ectopic Multiple Live Births   0               Home Medications    Prior to Admission medications   Medication Sig Start Date End Date Taking? Authorizing Provider  amLODipine (NORVASC) 5 MG tablet Take 2  tablets (10 mg total) by mouth daily. 09/13/16   Mesner, Corene Cornea, MD  cloNIDine (CATAPRES) 0.1 MG tablet Take 0.2 mg by mouth 2 (two) times daily.     [provider]  hydrochlorothiazide (HYDRODIURIL) 12.5 MG tablet Take 25 mg by mouth daily.     [provider]  olmesartan (BENICAR) 40 MG tablet 1 tablet daily. 08/14/16   [provider]  oxybutynin (DITROPAN) 5 MG tablet Take 1 tablet (5 mg total) by mouth 2 (two) times daily. 08/30/16   Jonnie Kind, MD  rivaroxaban Alveda Reasons) 20 MG TABS tablet Start after 11/23/14. 20mg  po daily Patient taking differently: 20 mg daily.  10/25/15   Annita Brod, MD    Family History Family History  Problem Relation Age of Onset  . Hypertension Mother   . Hypertension Father   . Hypertension Other     Social History Social History  Substance Use Topics  . Smoking status: Never Smoker  . Smokeless tobacco: Never Used  . Alcohol use No     Allergies   Patient has no known allergies.   Review of Systems Review of Systems  Respiratory: Positive for shortness of breath.   ROS: Statement: All systems negative except as  marked or noted in the HPI; Constitutional: Negative for fever and chills. ; ; Eyes: Negative for eye pain, redness and discharge. ; ; ENMT: Negative for ear pain, hoarseness, nasal congestion, sinus pressure and sore throat. ; ; Cardiovascular: Negative for chest pain, palpitations, diaphoresis, +chronic dyspnea and +peripheral edema. ; ; Respiratory: Negative for cough, wheezing and stridor. ; ; Gastrointestinal: Negative for nausea, vomiting, diarrhea, abdominal pain, blood in stool, hematemesis, jaundice and rectal bleeding. . ; ; Genitourinary: Negative for dysuria, flank pain and hematuria. ; ; Musculoskeletal: Negative for back pain and neck pain. Negative for swelling and trauma.; ; Skin: Negative for pruritus, rash, abrasions, blisters, bruising and skin lesion.; ; Neuro: +lightheadedness. Negative  for headache and neck stiffness. Negative for weakness, altered level of consciousness, altered mental status, extremity weakness, paresthesias, involuntary movement, seizure and syncope.       Physical Exam Updated Vital Signs BP (!) 147/75   Pulse 88   Temp 98.1 F (36.7 C) (Oral)   Resp 18   SpO2 100%     14:36:13 Orthostatic Vital Signs JC  Orthostatic Lying   BP- Lying: 121/69  Pulse- Lying: 83      Orthostatic Sitting  BP- Sitting: 127/74  Pulse- Sitting: 86      Orthostatic Standing at 0 minutes  BP- Standing at 0 minutes: 131/76  Pulse- Standing at 0 minutes: 85     Physical Exam 1350: Physical examination:  Nursing notes reviewed; Vital signs and O2 SAT reviewed;  Constitutional: Well developed, Well nourished, Well hydrated, In no acute distress; Head:  Normocephalic, atraumatic; Eyes: EOMI, PERRL, No scleral icterus; ENMT: Mouth and pharynx normal, Mucous membranes moist; Neck: Supple, Full range of motion, No lymphadenopathy; Cardiovascular: Regular rate and rhythm, No gallop; Respiratory: Breath sounds clear & equal bilaterally, No wheezes.  Speaking full sentences with ease, Normal respiratory effort/excursion; Chest: Nontender, Movement normal; Abdomen: Soft, Nontender, Nondistended, Normal bowel sounds; Genitourinary: No CVA tenderness; Extremities: Pulses normal, No tenderness, +tr pedal edema bilat. No calf edema or asymmetry.; Neuro: AA&Ox3, Major CN grossly intact.  Speech clear. No gross focal motor or sensory deficits in extremities.; Skin: Color normal, Warm, Dry.   ED Treatments / Results  Labs (all labs ordered are listed, but only abnormal results are displayed)   EKG  EKG Interpretation  Date/Time:  Sunday March 18 2017 13:35:20 EDT Ventricular Rate:  87 PR Interval:    QRS Duration: 98 QT Interval:  389 QTC Calculation: 468 R Axis:   52 Text Interpretation:  Sinus rhythm Low voltage, precordial leads Borderline repolarization abnormality  When compared with ECG of 11/23/2015 Rate slower Confirmed by [facility]  MD, Nunzio Cory [id number]) on 03/18/2017 1:56:19 PM       Radiology   Procedures Procedures (including critical care time)  Medications Ordered in ED Medications - No data to display   Initial Impression / Assessment and Plan / ED Course  I have reviewed the triage vital signs and the nursing notes.  Pertinent labs & imaging results that were available during my care of the patient were reviewed by me and considered in my medical decision making (see chart for details).  MDM Reviewed: previous chart, nursing note and vitals Reviewed previous: labs, ECG and ultrasound Interpretation: labs, ECG and x-ray    ECHOCARDIOGRAM W COLORFLOW SPECTRAL DOPPLER Routine 01/18/2017 10:35 AM EDT Dyspnea on exertion   Narrative   Left ventricular hypertrophy - mild   Normal left ventricular systolic function, ejection fraction 55 to 60%  Degenerative mitral valve disease   Aortic sclerosis   Normal right ventricular systolic function      Results for orders placed or performed during the hospital encounter of 65/03/54  Basic metabolic panel  Result Value Ref Range   Sodium 142 135 - 145 mmol/L   Potassium 2.7 (LL) 3.5 - 5.1 mmol/L   Chloride 104 101 - 111 mmol/L   CO2 28 22 - 32 mmol/L   Glucose, Bld 107 (H) 65 - 99 mg/dL   BUN 18 6 - 20 mg/dL   Creatinine, Ser 0.89 0.44 - 1.00 mg/dL   Calcium 9.2 8.9 - 10.3 mg/dL   GFR calc non Af Amer >60 >60 mL/min   GFR calc Af Amer >60 >60 mL/min   Anion gap 10 5 - 15  Brain natriuretic peptide  Result Value Ref Range   B Natriuretic Peptide 60.0 0.0 - 100.0 pg/mL  Troponin I  Result Value Ref Range   Troponin I <0.03 <0.03 ng/mL  CBC with Differential  Result Value Ref Range   WBC 6.5 4.0 - 10.5 K/uL   RBC 4.42 3.87 - 5.11 MIL/uL   Hemoglobin 10.7 (L) 12.0 - 15.0 g/dL   HCT 34.2 (L) 36.0 - 46.0 %   MCV 77.4 (L) 78.0 - 100.0 fL   MCH 24.2 (L) 26.0 - 34.0 pg    MCHC 31.3 30.0 - 36.0 g/dL   RDW 15.4 11.5 - 15.5 %   Platelets 270 150 - 400 K/uL   Neutrophils Relative % 63 %   Neutro Abs 4.1 1.7 - 7.7 K/uL   Lymphocytes Relative 22 %   Lymphs Abs 1.5 0.7 - 4.0 K/uL   Monocytes Relative 12 %   Monocytes Absolute 0.7 0.1 - 1.0 K/uL   Eosinophils Relative 2 %   Eosinophils Absolute 0.1 0.0 - 0.7 K/uL   Basophils Relative 1 %   Basophils Absolute 0.0 0.0 - 0.1 K/uL  Urinalysis, Routine w reflex microscopic  Result Value Ref Range   Color, Urine STRAW (A) YELLOW   APPearance CLEAR CLEAR   Specific Gravity, Urine 1.014 1.005 - 1.030   pH 5.0 5.0 - 8.0   Glucose, UA NEGATIVE NEGATIVE mg/dL   Hgb urine dipstick SMALL (A) NEGATIVE   Bilirubin Urine NEGATIVE NEGATIVE   Ketones, ur NEGATIVE NEGATIVE mg/dL   Protein, ur NEGATIVE NEGATIVE mg/dL   Nitrite NEGATIVE NEGATIVE   Leukocytes, UA NEGATIVE NEGATIVE   RBC / HPF 0-5 0 - 5 RBC/hpf   WBC, UA 0-5 0 - 5 WBC/hpf   Bacteria, UA NONE SEEN NONE SEEN   Squamous Epithelial / LPF 0-5 (A) NONE SEEN   Mucous PRESENT    Dg Chest 2 View Result Date: 03/18/2017 CLINICAL DATA:  Intermittent shortness of breath and ankle edema for 1 month. EXAM: CHEST  2 VIEW COMPARISON:  CT chest 10/24/2015.  PA and lateral chest 11/23/2015. FINDINGS: The lungs are clear. Heart size is normal. No pneumothorax or pleural effusion. Aortic atherosclerosis is identified. Port-A-Cath is unchanged. No acute bony abnormality. The patient is status post right rotator cuff repair. IMPRESSION: No acute disease. Atherosclerosis. Electronically Signed   By: Inge Rise M.D.   On: 03/18/2017 14:34   Ct Head Wo Contrast Result Date: 03/18/2017 CLINICAL DATA:  Lightheadedness beginning today. EXAM: CT HEAD WITHOUT CONTRAST TECHNIQUE: Contiguous axial images were obtained from the base of the skull through the vertex without intravenous contrast. COMPARISON:  PET-CT scan 11/23/2015. FINDINGS: Brain: No evidence of acute abnormality  including hemorrhage, infarct, mass lesion, mass effect, midline shift or abnormal extra-axial fluid collection. Scattered areas of hypoattenuation in the subcortical and periventricular deep white matter consistent with chronic microvascular disease are identified as on the prior exam. Vascular: Negative. Skull: Intact. Sinuses/Orbits: Negative. Other: None. IMPRESSION: No acute abnormality. Chronic microvascular ischemic change. Electronically Signed   By: Inge Rise M.D.   On: 03/18/2017 14:31     1535:  Pt ambulatory with steady gait, easy resps, Sats 100% R/A. Not orthostatic on VS. Labs per baseline. Recent Echo results above. Endorses compliance with xarelto; doubt PE today. Denies any change in her chronic symptoms for the past several months (per Cards MD note in Care Everywhere). Potassium repleted PO. Pt tol PO well while in the ED without N/V.  Workup overall reassuring. No clear indication for admission at this time. Dx and testing d/w pt.  Questions answered.  Verb understanding, agreeable to d/c home with outpt f/u.         Final Clinical Impressions(s) / ED Diagnoses   Final diagnoses:  None    New Prescriptions New Prescriptions   No medications on file      Francine Graven, DO 03/21/17 [id number]

## 2017-03-20 LAB — URINE CULTURE

## 2018-07-03 ENCOUNTER — Other Ambulatory Visit (HOSPITAL_COMMUNITY): Payer: Self-pay | Admitting: Family Medicine

## 2018-07-03 DIAGNOSIS — N6310 Unspecified lump in the right breast, unspecified quadrant: Secondary | ICD-10-CM

## 2018-07-09 ENCOUNTER — Ambulatory Visit (HOSPITAL_COMMUNITY)
Admission: RE | Admit: 2018-07-09 | Discharge: 2018-07-09 | Disposition: A | Discharge status: Home / Self Care | Payer: Medicare Other | Source: Ambulatory Visit | Attending: Family Medicine | Admitting: Family Medicine

## 2018-07-09 ENCOUNTER — Ambulatory Visit (HOSPITAL_COMMUNITY): Payer: Medicare Other

## 2018-07-09 DIAGNOSIS — N6310 Unspecified lump in the right breast, unspecified quadrant: Secondary | ICD-10-CM

## 2021-04-15 ENCOUNTER — Emergency Department (HOSPITAL_COMMUNITY): Payer: Medicare Other

## 2021-04-15 ENCOUNTER — Other Ambulatory Visit: Payer: Self-pay

## 2021-04-15 ENCOUNTER — Emergency Department (HOSPITAL_COMMUNITY)
Admission: EM | Admit: 2021-04-15 | Discharge: 2021-04-15 | Disposition: A | Discharge status: Home / Self Care | Payer: Medicare Other | Attending: Emergency Medicine | Admitting: Emergency Medicine

## 2021-04-15 DIAGNOSIS — Z79899 Other long term (current) drug therapy: Secondary | ICD-10-CM | POA: Diagnosis not present

## 2021-04-15 DIAGNOSIS — R42 Dizziness and giddiness: Secondary | ICD-10-CM | POA: Diagnosis present

## 2021-04-15 DIAGNOSIS — Z96659 Presence of unspecified artificial knee joint: Secondary | ICD-10-CM | POA: Diagnosis not present

## 2021-04-15 DIAGNOSIS — Z853 Personal history of malignant neoplasm of breast: Secondary | ICD-10-CM | POA: Diagnosis not present

## 2021-04-15 DIAGNOSIS — Z7901 Long term (current) use of anticoagulants: Secondary | ICD-10-CM | POA: Diagnosis not present

## 2021-04-15 DIAGNOSIS — Z96649 Presence of unspecified artificial hip joint: Secondary | ICD-10-CM | POA: Insufficient documentation

## 2021-04-15 DIAGNOSIS — I10 Essential (primary) hypertension: Secondary | ICD-10-CM | POA: Insufficient documentation

## 2021-04-15 LAB — CBC WITH DIFFERENTIAL/PLATELET
Abs Immature Granulocytes: 0.02 10*3/uL (ref 0.00–0.07)
Basophils Absolute: 0 10*3/uL (ref 0.0–0.1)
Basophils Relative: 0 %
Eosinophils Absolute: 0.1 10*3/uL (ref 0.0–0.5)
Eosinophils Relative: 1 %
HCT: 39.6 % (ref 36.0–46.0)
Hemoglobin: 12.2 g/dL (ref 12.0–15.0)
Immature Granulocytes: 0 %
Lymphocytes Relative: 23 %
Lymphs Abs: 1.2 10*3/uL (ref 0.7–4.0)
MCH: 27.8 pg (ref 26.0–34.0)
MCHC: 30.8 g/dL (ref 30.0–36.0)
MCV: 90.2 fL (ref 80.0–100.0)
Monocytes Absolute: 0.9 10*3/uL (ref 0.1–1.0)
Monocytes Relative: 17 %
Neutro Abs: 3 10*3/uL (ref 1.7–7.7)
Neutrophils Relative %: 59 %
Platelets: 183 10*3/uL (ref 150–400)
RBC: 4.39 MIL/uL (ref 3.87–5.11)
RDW: 16.3 % — ABNORMAL HIGH (ref 11.5–15.5)
WBC: 5.1 10*3/uL (ref 4.0–10.5)
nRBC: 0 % (ref 0.0–0.2)

## 2021-04-15 LAB — URINALYSIS, ROUTINE W REFLEX MICROSCOPIC
Bilirubin Urine: NEGATIVE
Glucose, UA: NEGATIVE mg/dL
Hgb urine dipstick: NEGATIVE
Ketones, ur: NEGATIVE mg/dL
Leukocytes,Ua: NEGATIVE
Nitrite: NEGATIVE
Protein, ur: NEGATIVE mg/dL
Specific Gravity, Urine: 1.012 (ref 1.005–1.030)
pH: 7 (ref 5.0–8.0)

## 2021-04-15 LAB — COMPREHENSIVE METABOLIC PANEL
ALT: 14 U/L (ref 0–44)
AST: 20 U/L (ref 15–41)
Albumin: 3.9 g/dL (ref 3.5–5.0)
Alkaline Phosphatase: 127 U/L — ABNORMAL HIGH (ref 38–126)
Anion gap: 9 (ref 5–15)
BUN: 13 mg/dL (ref 8–23)
CO2: 25 mmol/L (ref 22–32)
Calcium: 9 mg/dL (ref 8.9–10.3)
Chloride: 104 mmol/L (ref 98–111)
Creatinine, Ser: 0.73 mg/dL (ref 0.44–1.00)
GFR, Estimated: >60 mL/min (ref >60)
Glucose, Bld: 102 mg/dL — ABNORMAL HIGH (ref 70–99)
Potassium: 4 mmol/L (ref 3.5–5.1)
Sodium: 138 mmol/L (ref 135–145)
Total Bilirubin: 0.8 mg/dL (ref 0.3–1.2)
Total Protein: 7.7 g/dL (ref 6.5–8.1)

## 2021-04-15 NOTE — ED Provider Notes (Signed)
[facility] EMERGENCY DEPARTMENT Provider Note   CSN: [id number] Arrival date & time: 04/15/21  1042     History Chief Complaint  Patient presents with   Dizziness    Margaret Avila is a 70 y.o. female.  Patient brought in by [facility].  Patient was over at a neighbors house apparently reported some lightheadedness with standing but no room spinning.  Blood pressure was elevated 196/113.  Patient states they insisted that she come in but she states she is feeling fine.  No chest pain no shortness of breath.  Patient states that her regular doctor adjusted her blood pressure medicines about a week ago and she has been taking them like she is supposed to.  But her pressures have been running a little high.  Patient without any fevers cough congestion abdominal pain nausea or vomiting.  No strokelike symptoms.      Past Medical History:  Diagnosis Date   Acid reflux    Breast cancer ([place]) Left breast cancer with lymph node removal. Completed Chemo 2016. due to start radiation 11/03/15. Treated by Dr Sherre Lain in [place] [facility])    DOE (dyspnea on exertion)    chronic   Dyspnea    chronic   Hiatal hernia    Hyperlipidemia    Hypertension    PE (pulmonary thromboembolism) ([place])    Vertigo     Patient Active Problem List   Diagnosis Date Noted   Bilateral pulmonary embolism ([place]) 10/24/2015   Hypertension 10/24/2015   Breast cancer ([place]) 10/24/2015   Multiple pulmonary emboli ([place]) 10/24/2015    Past Surgical History:  Procedure Laterality Date   ABDOMINAL HYSTERECTOMY     TOTAL HIP ARTHROPLASTY     TOTAL KNEE ARTHROPLASTY       OB History     Gravida  3   Para  3   Term  3   Preterm      AB  0   Living  4      SAB  0   IAB      Ectopic      Multiple      Live Births              Family History  Problem Relation Age of Onset   Hypertension Mother    Hypertension Father    Hypertension Other     Social History   Tobacco Use    Smoking status: Never   Smokeless tobacco: Never  Substance Use Topics   Alcohol use: No   Drug use: No    Home Medications Prior to Admission medications   Medication Sig Start Date End Date Taking? Authorizing Provider  amLODipine (NORVASC) 5 MG tablet Take 2 tablets (10 mg total) by mouth daily. 09/13/16   Mesner, Corene Cornea, MD  atorvastatin (LIPITOR) 40 MG tablet Take 40 mg by mouth daily.    [provider]  cloNIDine (CATAPRES) 0.2 MG tablet Take 0.2 mg by mouth 2 (two) times daily.     [provider]  gabapentin (NEURONTIN) 300 MG capsule Take 300 mg by mouth 3 (three) times daily.    [provider]  hydrochlorothiazide (HYDRODIURIL) 25 MG tablet Take by mouth daily.     [provider]  olmesartan (BENICAR) 40 MG tablet 1 tablet daily. 08/14/16   [provider]  oxybutynin (DITROPAN) 5 MG tablet Take 1 tablet (5 mg total) by mouth 2 (two) times daily. 08/30/16   Mallory Shirk  V, MD  rivaroxaban (XARELTO) 20 MG TABS tablet Start after 11/23/14. 15m po daily Patient taking differently: 20 mg daily.  10/25/15   KAnnita Brod MD    Allergies    Patient has no known allergies.  Review of Systems   Review of Systems  Constitutional:  Negative for chills and fever.  HENT:  Negative for ear pain and sore throat.   Eyes:  Negative for pain and visual disturbance.  Respiratory:  Negative for cough and shortness of breath.   Cardiovascular:  Negative for chest pain and palpitations.  Gastrointestinal:  Negative for abdominal pain and vomiting.  Genitourinary:  Negative for dysuria and hematuria.  Musculoskeletal:  Negative for arthralgias and back pain.  Skin:  Negative for color change and rash.  Neurological:  Positive for light-headedness. Negative for seizures and syncope.  All other systems reviewed and are negative.  Physical Exam Updated Vital Signs BP (!) 166/95   Pulse (!) 117   Temp 98.9 F (37.2 C) (Oral)   Resp (!)  44   Ht 1.6 m (5' 3" )   SpO2 99%   BMI 31.60 kg/m   Physical Exam Vitals and nursing note reviewed.  Constitutional:      General: She is not in acute distress.    Appearance: She is well-developed. She is not toxic-appearing.  HENT:     Head: Normocephalic and atraumatic.  Eyes:     Extraocular Movements: Extraocular movements intact.     Conjunctiva/sclera: Conjunctivae normal.     Pupils: Pupils are equal, round, and reactive to light.  Cardiovascular:     Rate and Rhythm: Normal rate and regular rhythm.     Heart sounds: No murmur heard. Pulmonary:     Effort: Pulmonary effort is normal. No respiratory distress.     Breath sounds: Normal breath sounds. No wheezing.  Abdominal:     Palpations: Abdomen is soft.     Tenderness: There is no abdominal tenderness.  Musculoskeletal:        General: Normal range of motion.     Cervical back: Normal range of motion and neck supple.  Skin:    General: Skin is warm and dry.  Neurological:     General: No focal deficit present.     Mental Status: She is alert and oriented to person, place, and time.     Cranial Nerves: No cranial nerve deficit.     Sensory: No sensory deficit.     Motor: No weakness.    ED Results / Procedures / Treatments   Labs (all labs ordered are listed, but only abnormal results are displayed) Labs Reviewed  COMPREHENSIVE METABOLIC PANEL - Abnormal; Notable for the following components:      Result Value   Glucose, Bld 102 (*)    Alkaline Phosphatase 127 (*)    All other components within normal limits  CBC WITH DIFFERENTIAL/PLATELET - Abnormal; Notable for the following components:   RDW 16.3 (*)    All other components within normal limits  URINALYSIS, ROUTINE W REFLEX MICROSCOPIC    EKG EKG Interpretation  Date/Time:  Friday April 15 2021 10:52:10 EDT Ventricular Rate:  96 PR Interval:  145 QRS Duration: 98 QT Interval:  349 QTC Calculation: 441 R Axis:   43 Text Interpretation: Sinus  rhythm Probable left ventricular hypertrophy Abnormal inferior Q waves Inferior Q waves a little more prominent compared to EKG from 2018. Confirmed by ZFredia [phone] on 04/15/2021 10:57:20 AM  Radiology DG Chest  1 View  Result Date: 04/15/2021 CLINICAL DATA:  Dizziness and high blood pressure. EXAM: CHEST  1 VIEW COMPARISON:  03/18/2017 FINDINGS: 1141 hours. The lungs are clear without focal pneumonia, edema, pneumothorax or pleural effusion. The cardiopericardial silhouette is within normal limits for size. Right Port-A-Cath again noted. The visualized bony structures of the thorax show no acute abnormality. Telemetry leads overlie the chest. IMPRESSION: No active disease. Electronically Signed   By: Misty Stanley M.D.   On: 04/15/2021 12:29   CT Head Wo Contrast  Result Date: 04/15/2021 CLINICAL DATA:  Dizziness, hypertension EXAM: CT HEAD WITHOUT CONTRAST TECHNIQUE: Contiguous axial images were obtained from the base of the skull through the vertex without intravenous contrast. COMPARISON:  03/18/2017 FINDINGS: Brain: No evidence of acute infarction, hemorrhage, hydrocephalus, extra-axial collection or mass lesion/mass effect. Mild-moderate low-density changes within the periventricular and subcortical white matter compatible with chronic microvascular ischemic change. Mild diffuse cerebral volume loss. Vascular: Atherosclerotic calcifications involving the large vessels of the skull base. No unexpected hyperdense vessel. Skull: Normal. Negative for fracture or focal lesion. Sinuses/Orbits: No acute finding. Other: None. IMPRESSION: 1. No acute intracranial findings. 2. Chronic microvascular ischemic change and cerebral volume loss. Electronically Signed   By: Davina Poke D.O.   On: 04/15/2021 12:36    Procedures Procedures   Medications Ordered in ED Medications - No data to display  ED Course  I have reviewed the triage vital signs and the nursing notes.  Pertinent labs & imaging  results that were available during my care of the patient were reviewed by me and considered in my medical decision making (see chart for details).    MDM Rules/Calculators/A&P                          CT head without any acute findings.  Chest x-ray without any acute findings.  Labs without significant abnormalities.  Renal functions normal.  Alk phos elevated at 127 but no other liver function test abnormalities.  White blood cell count was 5.1.  Hemoglobin 12.2.  Patient's most recent blood pressure 166/95.  Little bit of resting tachycardia which is sinus.  At 114.  Patient's initial EKG had a heart rate of 96.  Patient denies any shortness of breath.  No hypoxia.  Oxygen saturations on room air 100%.  Patient without any chest pain.  Patient took her blood pressure medicines this morning.  She is on Catapres twice a day.  She normally takes second time she says at bedtime.  She took her first 1 at 6 this morning.  Do not feel that I need to redosed that.  Blood pressure is reasonable but needs close follow-up by her primary care doctor which patient understands.  She will return for any new or worse symptoms. Final Clinical Impression(s) / ED Diagnoses Final diagnoses:  Lightheadedness  Primary hypertension    Rx / DC Orders ED Discharge Orders     None        Fredia Sorrow, MD 04/15/21 1536

## 2021-04-15 NOTE — ED Notes (Signed)
MD notified re: HR, no new orders at this time

## 2021-04-15 NOTE — Discharge Instructions (Addendum)
Blood pressure was running a little high here today.  Would recommend seeing your regular doctor that is managing your blood pressure in the next few days.  To see if they need to do further adjustment.  Continue to take your blood pressures as directed.  Return for any new or worse symptoms return for headache chest pain trouble breathing or any strokelike symptoms.  Today's work-up was negative.

## 2021-04-15 NOTE — ED Triage Notes (Addendum)
Pt in from a neighbors house via [facility], per report the client reported dizziness with standing, pts BP 196/113, HR 118, CBG 93, pt ST in route, pt A&O x4,  pt reports taking all meds today, denies CP

## 2021-04-15 NOTE — ED Notes (Signed)
Zackowski, MD aware of pts BP 191/108, no new orders placed

## 2021-04-30 ENCOUNTER — Encounter (HOSPITAL_COMMUNITY): Payer: Self-pay | Admitting: Emergency Medicine

## 2021-04-30 ENCOUNTER — Emergency Department (HOSPITAL_COMMUNITY): Payer: Medicare Other

## 2021-04-30 ENCOUNTER — Observation Stay (HOSPITAL_COMMUNITY)
Admission: EM | Admit: 2021-04-30 | Discharge: 2021-05-02 | Disposition: A | Discharge status: Home / Self Care | Payer: Medicare Other | Attending: Internal Medicine | Admitting: Internal Medicine

## 2021-04-30 ENCOUNTER — Other Ambulatory Visit: Payer: Self-pay

## 2021-04-30 DIAGNOSIS — I10 Essential (primary) hypertension: Secondary | ICD-10-CM | POA: Diagnosis not present

## 2021-04-30 DIAGNOSIS — Z96649 Presence of unspecified artificial hip joint: Secondary | ICD-10-CM | POA: Insufficient documentation

## 2021-04-30 DIAGNOSIS — Z96659 Presence of unspecified artificial knee joint: Secondary | ICD-10-CM | POA: Diagnosis not present

## 2021-04-30 DIAGNOSIS — Z79899 Other long term (current) drug therapy: Secondary | ICD-10-CM | POA: Insufficient documentation

## 2021-04-30 DIAGNOSIS — Z7901 Long term (current) use of anticoagulants: Secondary | ICD-10-CM | POA: Diagnosis not present

## 2021-04-30 DIAGNOSIS — I6782 Cerebral ischemia: Secondary | ICD-10-CM | POA: Insufficient documentation

## 2021-04-30 DIAGNOSIS — Z20822 Contact with and (suspected) exposure to covid-19: Secondary | ICD-10-CM | POA: Insufficient documentation

## 2021-04-30 DIAGNOSIS — R519 Headache, unspecified: Secondary | ICD-10-CM

## 2021-04-30 DIAGNOSIS — G459 Transient cerebral ischemic attack, unspecified: Secondary | ICD-10-CM | POA: Diagnosis present

## 2021-04-30 DIAGNOSIS — I2699 Other pulmonary embolism without acute cor pulmonale: Secondary | ICD-10-CM | POA: Diagnosis present

## 2021-04-30 DIAGNOSIS — R296 Repeated falls: Secondary | ICD-10-CM | POA: Insufficient documentation

## 2021-04-30 DIAGNOSIS — E785 Hyperlipidemia, unspecified: Secondary | ICD-10-CM | POA: Diagnosis present

## 2021-04-30 DIAGNOSIS — Z8673 Personal history of transient ischemic attack (TIA), and cerebral infarction without residual deficits: Secondary | ICD-10-CM | POA: Insufficient documentation

## 2021-04-30 DIAGNOSIS — C50919 Malignant neoplasm of unspecified site of unspecified female breast: Secondary | ICD-10-CM | POA: Diagnosis present

## 2021-04-30 DIAGNOSIS — R2681 Unsteadiness on feet: Secondary | ICD-10-CM | POA: Diagnosis not present

## 2021-04-30 DIAGNOSIS — Z853 Personal history of malignant neoplasm of breast: Secondary | ICD-10-CM | POA: Diagnosis not present

## 2021-04-30 LAB — RESP PANEL BY RT-PCR (FLU A&B, COVID) ARPGX2
Influenza A by PCR: NEGATIVE
Influenza B by PCR: NEGATIVE
SARS Coronavirus 2 by RT PCR: NEGATIVE

## 2021-04-30 LAB — BASIC METABOLIC PANEL
Anion gap: 7 (ref 5–15)
BUN: 15 mg/dL (ref 8–23)
CO2: 24 mmol/L (ref 22–32)
Calcium: 8.7 mg/dL — ABNORMAL LOW (ref 8.9–10.3)
Chloride: 104 mmol/L (ref 98–111)
Creatinine, Ser: 0.76 mg/dL (ref 0.44–1.00)
GFR, Estimated: >60 mL/min (ref >60)
Glucose, Bld: 107 mg/dL — ABNORMAL HIGH (ref 70–99)
Potassium: 3.2 mmol/L — ABNORMAL LOW (ref 3.5–5.1)
Sodium: 135 mmol/L (ref 135–145)

## 2021-04-30 LAB — CBC WITH DIFFERENTIAL/PLATELET
Abs Immature Granulocytes: 0.03 10*3/uL (ref 0.00–0.07)
Basophils Absolute: 0 10*3/uL (ref 0.0–0.1)
Basophils Relative: 0 %
Eosinophils Absolute: 0 10*3/uL (ref 0.0–0.5)
Eosinophils Relative: 0 %
HCT: 35.8 % — ABNORMAL LOW (ref 36.0–46.0)
Hemoglobin: 11.5 g/dL — ABNORMAL LOW (ref 12.0–15.0)
Immature Granulocytes: 0 %
Lymphocytes Relative: 15 %
Lymphs Abs: 1.4 10*3/uL (ref 0.7–4.0)
MCH: 27.9 pg (ref 26.0–34.0)
MCHC: 32.1 g/dL (ref 30.0–36.0)
MCV: 86.9 fL (ref 80.0–100.0)
Monocytes Absolute: 0.8 10*3/uL (ref 0.1–1.0)
Monocytes Relative: 9 %
Neutro Abs: 6.9 10*3/uL (ref 1.7–7.7)
Neutrophils Relative %: 76 %
Platelets: 243 10*3/uL (ref 150–400)
RBC: 4.12 MIL/uL (ref 3.87–5.11)
RDW: 16.3 % — ABNORMAL HIGH (ref 11.5–15.5)
WBC: 9.1 10*3/uL (ref 4.0–10.5)
nRBC: 0 % (ref 0.0–0.2)

## 2021-04-30 LAB — APTT: aPTT: 37 seconds — ABNORMAL HIGH (ref 24–36)

## 2021-04-30 LAB — PROTIME-INR
INR: 2.6 — ABNORMAL HIGH (ref 0.8–1.2)
Prothrombin Time: 28 seconds — ABNORMAL HIGH (ref 11.4–15.2)

## 2021-04-30 MED ORDER — OXYBUTYNIN CHLORIDE 5 MG PO TABS: 5.0000 mg | ORAL_TABLET | Freq: Two times a day (BID) | ORAL | Status: DC

## 2021-04-30 MED ORDER — STROKE: EARLY STAGES OF RECOVERY BOOK
Freq: Once | Status: DC
  Filled 2021-04-30: qty 1

## 2021-04-30 MED ORDER — ACETAMINOPHEN 325 MG PO TABS
650.0000 mg | ORAL_TABLET | ORAL | Status: DC | PRN
  Administered 2021-05-01: 650 mg via ORAL
  Filled 2021-04-30: qty 2

## 2021-04-30 MED ORDER — ACETAMINOPHEN 160 MG/5ML PO SOLN: 650.0000 mg | ORAL | Status: DC | PRN

## 2021-04-30 MED ORDER — GABAPENTIN 300 MG PO CAPS: 300.0000 mg | ORAL_CAPSULE | Freq: Three times a day (TID) | ORAL | Status: DC

## 2021-04-30 MED ORDER — ACETAMINOPHEN 325 MG PO TABS
650.0000 mg | ORAL_TABLET | Freq: Once | ORAL | Status: AC
  Administered 2021-04-30: 650 mg via ORAL
  Filled 2021-04-30: qty 2

## 2021-04-30 MED ORDER — HYDRALAZINE HCL 20 MG/ML IJ SOLN: 5.0000 mg | Freq: Four times a day (QID) | INTRAMUSCULAR | Status: DC | PRN

## 2021-04-30 MED ORDER — SODIUM CHLORIDE 0.9 % IV BOLUS
500.0000 mL | Freq: Once | INTRAVENOUS | Status: AC
  Administered 2021-04-30: 500 mL via INTRAVENOUS

## 2021-04-30 MED ORDER — METOCLOPRAMIDE HCL 5 MG/ML IJ SOLN
5.0000 mg | Freq: Once | INTRAMUSCULAR | Status: AC
  Administered 2021-04-30: 5 mg via INTRAVENOUS
  Filled 2021-04-30: qty 2

## 2021-04-30 MED ORDER — ACETAMINOPHEN 650 MG RE SUPP: 650.0000 mg | RECTAL | Status: DC | PRN

## 2021-04-30 MED ORDER — RIVAROXABAN 20 MG PO TABS
20.0000 mg | ORAL_TABLET | Freq: Every day | ORAL | Status: DC
  Administered 2021-05-01 – 2021-05-02 (×2): 20 mg via ORAL
  Filled 2021-04-30 (×2): qty 1

## 2021-04-30 MED ORDER — HYDRALAZINE HCL 20 MG/ML IJ SOLN
5.0000 mg | Freq: Once | INTRAMUSCULAR | Status: DC
  Filled 2021-04-30: qty 1

## 2021-04-30 MED ORDER — POTASSIUM CHLORIDE 20 MEQ PO PACK
40.0000 meq | PACK | Freq: Once | ORAL | Status: AC
  Administered 2021-05-01: 40 meq via ORAL
  Filled 2021-04-30: qty 2

## 2021-04-30 MED ORDER — CAPECITABINE 500 MG PO TABS
1000.0000 mg | ORAL_TABLET | Freq: Two times a day (BID) | ORAL | Status: DC
  Administered 2021-05-01 – 2021-05-02 (×2): 1000 mg via ORAL
  Filled 2021-04-30 (×3): qty 2

## 2021-04-30 MED ORDER — HYDRALAZINE HCL 20 MG/ML IJ SOLN
5.0000 mg | Freq: Once | INTRAMUSCULAR | Status: AC
  Administered 2021-04-30: 5 mg via INTRAVENOUS

## 2021-04-30 MED ORDER — ATORVASTATIN CALCIUM 40 MG PO TABS
40.0000 mg | ORAL_TABLET | Freq: Every day | ORAL | Status: DC
  Administered 2021-05-01: 40 mg via ORAL
  Filled 2021-04-30: qty 1

## 2021-04-30 MED ORDER — SENNOSIDES-DOCUSATE SODIUM 8.6-50 MG PO TABS: 1.0000 | ORAL_TABLET | Freq: Every evening | ORAL | Status: DC | PRN

## 2021-04-30 NOTE — ED Notes (Signed)
Patient transported to CT 

## 2021-04-30 NOTE — ED Triage Notes (Addendum)
Pt arrived via EMS c/o HA and HTN. Pt seen here 2 weeks ago for the same. Per EMS BP was 189/97

## 2021-04-30 NOTE — ED Provider Notes (Signed)
[facility] Provider Note   CSN: [id number] Arrival date & time: 04/30/21  1749     History Chief Complaint  Patient presents with   Hypertension    Margaret Avila is a 70 y.o. female.  HPI  Patient with significant medical history of hypertension, PE, vertigo, breast cancer presents with chief complaint of a headache.  Patient states she had a headache that started today, states it is in the front of her head, its not associated with change in vision, paresthesias, weakness in the upper and lower extremities, she denies recent head trauma, not on anticoagulant.  She denies photophobia, increased sensitivity noise, nausea or vomiting.  Patient states she has had migraines in the past this feels similar to that.  Patient also notes that her blood pressure is high today, states it generally runs high, she states that she took all of her blood pressure medications, has no other complaints at this time.  She does not endorse chest pain, shortness of breath, abdominal pain, urinary symptoms pedal edema.  Past Medical History:  Diagnosis Date   Acid reflux    Breast cancer ([place]) Left breast cancer with lymph node removal. Completed Chemo 2016. due to start radiation 11/03/15. Treated by Dr Sherre Lain in [place] [facility])    DOE (dyspnea on exertion)    chronic   Dyspnea    chronic   Hiatal hernia    Hyperlipidemia    Hypertension    PE (pulmonary thromboembolism) ([place])    Vertigo     Patient Active Problem List   Diagnosis Date Noted   TIA (transient ischemic attack) 04/30/2021   Bilateral pulmonary embolism ([place]) 10/24/2015   Hypertension 10/24/2015   Breast cancer ([place]) 10/24/2015   Multiple pulmonary emboli ([place]) 10/24/2015    Past Surgical History:  Procedure Laterality Date   ABDOMINAL HYSTERECTOMY     TOTAL HIP ARTHROPLASTY     TOTAL KNEE ARTHROPLASTY       OB History     Gravida  3   Para  3   Term  3   Preterm      AB  0    Living  4      SAB  0   IAB      Ectopic      Multiple      Live Births              Family History  Problem Relation Age of Onset   Hypertension Mother    Hypertension Father    Hypertension Other     Social History   Tobacco Use   Smoking status: Never   Smokeless tobacco: Never  Substance Use Topics   Alcohol use: No   Drug use: No    Home Medications Prior to Admission medications   Medication Sig Start Date End Date Taking? Authorizing Provider  amLODipine (NORVASC) 10 MG tablet Take 10 mg by mouth daily.   Yes [provider]  atorvastatin (LIPITOR) 40 MG tablet Take 40 mg by mouth daily.   Yes [provider]  atorvastatin (LIPITOR) 40 MG tablet Take 40 mg by mouth daily. 12/07/16  Yes [provider]  capecitabine (XELODA) 500 MG tablet Take 1,000 mg/m2 by mouth 2 (two) times daily after a meal.   Yes [provider]  losartan (COZAAR) 25 MG tablet Take 25 mg by mouth daily. 04/27/21  Yes [provider]  metoprolol tartrate (LOPRESSOR) 25 MG tablet Take  25 mg by mouth daily. 01/19/21  Yes [provider]  rivaroxaban (XARELTO) 20 MG TABS tablet Start after 11/23/14. '20mg'$  po daily Patient taking differently: 20 mg daily. 10/25/15  Yes Annita Brod, MD  amLODipine (NORVASC) 5 MG tablet Take 2 tablets (10 mg total) by mouth daily. Patient not taking: Reported on 04/30/2021 09/13/16   Mesner, Corene Cornea, MD  cloNIDine (CATAPRES) 0.2 MG tablet Take 0.2 mg by mouth 2 (two) times daily.  Patient not taking: Reported on 04/30/2021    [provider]  gabapentin (NEURONTIN) 300 MG capsule Take 300 mg by mouth 3 (three) times daily. Patient not taking: Reported on 04/30/2021    [provider]  hydrochlorothiazide (HYDRODIURIL) 25 MG tablet Take by mouth daily.  Patient not taking: Reported on 04/30/2021    [provider]  olmesartan (BENICAR) 40 MG tablet 1 tablet daily. Patient not taking:  Reported on 04/30/2021 08/14/16   [provider]  oxybutynin (DITROPAN) 5 MG tablet Take 1 tablet (5 mg total) by mouth 2 (two) times daily. Patient not taking: Reported on 04/30/2021 08/30/16   Jonnie Kind, MD    Allergies    Patient has no known allergies.  Review of Systems   Review of Systems  Constitutional:  Negative for chills and fever.  HENT:  Negative for congestion.   Eyes:  Negative for photophobia and visual disturbance.  Respiratory:  Negative for shortness of breath.   Cardiovascular:  Negative for chest pain.  Gastrointestinal:  Negative for abdominal pain.  Genitourinary:  Negative for enuresis.  Musculoskeletal:  Negative for back pain.  Skin:  Negative for rash.  Neurological:  Positive for headaches. Negative for dizziness, weakness and numbness.  Hematological:  Does not bruise/bleed easily.   Physical Exam Updated Vital Signs BP (!) 142/115   Pulse (!) 102   Temp 99.3 F (37.4 C) (Oral)   Resp (!) 24   Ht '5\' 3"'$  (1.6 m)   Wt 77.1 kg   SpO2 100%   BMI 30.11 kg/m   Physical Exam Vitals and nursing note reviewed.  Constitutional:      General: She is not in acute distress.    Appearance: She is not ill-appearing.  HENT:     Head: Normocephalic and atraumatic.     Comments: No gross deformities present.    Nose: No congestion.  Eyes:     Extraocular Movements: Extraocular movements intact.     Conjunctiva/sclera: Conjunctivae normal.     Pupils: Pupils are equal, round, and reactive to light.  Cardiovascular:     Rate and Rhythm: Normal rate and regular rhythm.     Pulses: Normal pulses.     Heart sounds: No murmur heard.   No friction rub. No gallop.  Pulmonary:     Effort: No respiratory distress.     Breath sounds: No wheezing, rhonchi or rales.  Abdominal:     Palpations: Abdomen is soft.     Tenderness: There is no abdominal tenderness.  Musculoskeletal:     Right lower leg: No edema.     Left lower leg: No edema.      Comments: Patient had full range of motion, 5 5 strength in the upper and lower extremities, neurovascular fully intact.  Skin:    General: Skin is warm and dry.  Neurological:     Mental Status: She is alert.     GCS: GCS eye subscore is 4. GCS verbal subscore is 5. GCS motor subscore is  6.     Sensory: Sensation is intact.     Motor: No weakness.     Coordination: Romberg sign negative.     Comments: Cranial nerves II through XII are grossly intact, patient is having no difficulty with word finding, no slurring of the words, able to follow commands, no unilateral weakness.  Psychiatric:        Mood and Affect: Mood normal.    ED Results / Procedures / Treatments   Labs (all labs ordered are listed, but only abnormal results are displayed) Labs Reviewed  BASIC METABOLIC PANEL - Abnormal; Notable for the following components:      Result Value   Potassium 3.2 (*)    Glucose, Bld 107 (*)    Calcium 8.7 (*)    All other components within normal limits  CBC WITH DIFFERENTIAL/PLATELET - Abnormal; Notable for the following components:   Hemoglobin 11.5 (*)    HCT 35.8 (*)    RDW 16.3 (*)    All other components within normal limits  PROTIME-INR - Abnormal; Notable for the following components:   Prothrombin Time 28.0 (*)    INR 2.6 (*)    All other components within normal limits  APTT - Abnormal; Notable for the following components:   aPTT 37 (*)    All other components within normal limits  RESP PANEL BY RT-PCR (FLU A&B, COVID) [id number]    EKG None  Radiology CT Head Wo Contrast  Result Date: 04/30/2021 CLINICAL DATA:  Headache and hypertension. History of breast cancer. EXAM: CT HEAD WITHOUT CONTRAST TECHNIQUE: Contiguous axial images were obtained from the base of the skull through the vertex without intravenous contrast. COMPARISON:  CT head dated 04/15/2021. FINDINGS: Brain: No evidence of acute infarction, hemorrhage, hydrocephalus, extra-axial collection or mass  lesion/mass effect. Periventricular white matter hypoattenuation likely represents chronic small vessel ischemic disease. There is mild cerebral volume loss with associated ex vacuo dilatation. Vascular: There are vascular calcifications in the carotid siphons. Skull: Normal. Negative for fracture or focal lesion. Sinuses/Orbits: No acute finding. Other: None. IMPRESSION: No acute intracranial process. Electronically Signed   By: Zerita Boers M.D.   On: 04/30/2021 19:04    Procedures Procedures   Medications Ordered in ED Medications  metoCLOPramide (REGLAN) injection 5 mg (5 mg Intravenous Given 04/30/21 1906)  acetaminophen (TYLENOL) tablet 650 mg (650 mg Oral Given 04/30/21 1906)  sodium chloride 0.9 % bolus 500 mL (0 mLs Intravenous Stopped 04/30/21 2022)  hydrALAZINE (APRESOLINE) injection 5 mg (5 mg Intravenous Given 04/30/21 1919)    ED Course  I have reviewed the triage vital signs and the nursing notes.  Pertinent labs & imaging results that were available during my care of the patient were reviewed by me and considered in my medical decision making (see chart for details).    MDM Rules/Calculators/A&P                          Initial impression-patient presents with a headache.  She is alert, does not appear in distress, vital signs are for hypertension.  Will obtain CT head, basic lab work-up, provide patient with fluids, migraine cocktail, hydralazine and reassess.  Work-up-CBC unremarkable, BMP shows slight hypokalemia 3.2, glucose 117, calcium 8.7.  CT head negative for acute findings.  Reassessment-patient was reassessed, states she is feeling much better, has no other complaints at this time.  Patient's blood pressure still remains elevated we will provide her with a small  dose of hydralazine and reassess.  Patient is reassessed, blood pressure is starting to improve, as I was about to discharge the patient the patient told me that she had a moment around 1 PM today where she  was unable to talk and forgot the names of her relatives, she states this lasted for about 20 minutes and then resolved.  I am concerned that the patient may have had a TIA, will consult with neurology for further recommendations.  Consult- Spoke with Dr Lorrin Goodell of neurology, he feels that the patient may have had a small TIA and will need further work-up, he would like the patient transferred to [facility], admitted to medicine where she can have a TIA work-up, he does not recommend Plavix or aspirin at this time as patient is currently on Eliquis, he would not treat the blood pressure unless it goes above [id number] systolic and [id number] diastolic  Rule out-I have low suspicion for intracranial head bleed as CT head is negative for acute findings.  I have low suspicion for CVA or cerebellar infarct as there is no focal deficits present my exam, patient has no dizziness or does not feel off balance.  Suspicion for meningitis as she has no meningeal sign, no neck pain, no leukocytosis seen on CBC.  Low suspicion for systemic infection as patient is nontoxic-appearing, vital signs reassuring.  Low suspicion for hypertensive emergency or urgency is no signs of organ damage present my exam.  Plan-admit to medicine for TIA work-up, patient be transferred to [facility] where she will be evaluated by neurology and have a MRI. Final Clinical Impression(s) / ED Diagnoses Final diagnoses:  Bad headache  Hypertension, unspecified type    Rx / DC Orders ED Discharge Orders     None        Aron Baba 04/30/21 2213    Milton Ferguson, MD 05/02/21 1004

## 2021-05-01 ENCOUNTER — Observation Stay (HOSPITAL_COMMUNITY): Payer: Medicare Other

## 2021-05-01 ENCOUNTER — Observation Stay (HOSPITAL_BASED_OUTPATIENT_CLINIC_OR_DEPARTMENT_OTHER): Payer: Medicare Other

## 2021-05-01 DIAGNOSIS — I2699 Other pulmonary embolism without acute cor pulmonale: Secondary | ICD-10-CM

## 2021-05-01 DIAGNOSIS — Z79899 Other long term (current) drug therapy: Secondary | ICD-10-CM | POA: Diagnosis not present

## 2021-05-01 DIAGNOSIS — I10 Essential (primary) hypertension: Secondary | ICD-10-CM | POA: Diagnosis not present

## 2021-05-01 DIAGNOSIS — G459 Transient cerebral ischemic attack, unspecified: Secondary | ICD-10-CM | POA: Diagnosis not present

## 2021-05-01 DIAGNOSIS — Z8673 Personal history of transient ischemic attack (TIA), and cerebral infarction without residual deficits: Secondary | ICD-10-CM | POA: Diagnosis not present

## 2021-05-01 DIAGNOSIS — E785 Hyperlipidemia, unspecified: Secondary | ICD-10-CM | POA: Diagnosis not present

## 2021-05-01 DIAGNOSIS — Z853 Personal history of malignant neoplasm of breast: Secondary | ICD-10-CM | POA: Diagnosis not present

## 2021-05-01 DIAGNOSIS — Z7901 Long term (current) use of anticoagulants: Secondary | ICD-10-CM | POA: Diagnosis not present

## 2021-05-01 DIAGNOSIS — Z20822 Contact with and (suspected) exposure to covid-19: Secondary | ICD-10-CM | POA: Diagnosis not present

## 2021-05-01 DIAGNOSIS — C50912 Malignant neoplasm of unspecified site of left female breast: Secondary | ICD-10-CM | POA: Diagnosis not present

## 2021-05-01 DIAGNOSIS — Z96649 Presence of unspecified artificial hip joint: Secondary | ICD-10-CM | POA: Diagnosis not present

## 2021-05-01 DIAGNOSIS — Z96659 Presence of unspecified artificial knee joint: Secondary | ICD-10-CM | POA: Diagnosis not present

## 2021-05-01 DIAGNOSIS — R519 Headache, unspecified: Secondary | ICD-10-CM | POA: Diagnosis present

## 2021-05-01 LAB — URINALYSIS, COMPLETE (UACMP) WITH MICROSCOPIC
Bilirubin Urine: NEGATIVE
Glucose, UA: NEGATIVE mg/dL
Ketones, ur: NEGATIVE mg/dL
Leukocytes,Ua: NEGATIVE
Nitrite: NEGATIVE
Protein, ur: NEGATIVE mg/dL
RBC / HPF: NONE SEEN RBC/hpf (ref 0–5)
Specific Gravity, Urine: 1.02 (ref 1.005–1.030)
pH: 6 (ref 5.0–8.0)

## 2021-05-01 LAB — ECHOCARDIOGRAM COMPLETE
AR max vel: 2.72 cm2
AV Area VTI: 2.76 cm2
AV Area mean vel: 2.78 cm2
AV Mean grad: 5 mmHg
AV Peak grad: 8.9 mmHg
Ao pk vel: 1.49 m/s
Area-P 1/2: 2.91 cm2
Height: 63 in
MV VTI: 3.36 cm2
S' Lateral: 1.9 cm
Weight: 2720 oz

## 2021-05-01 LAB — COMPREHENSIVE METABOLIC PANEL
ALT: 10 U/L (ref 0–44)
AST: 16 U/L (ref 15–41)
Albumin: 3.7 g/dL (ref 3.5–5.0)
Alkaline Phosphatase: 118 U/L (ref 38–126)
Anion gap: 5 (ref 5–15)
BUN: 11 mg/dL (ref 8–23)
CO2: 23 mmol/L (ref 22–32)
Calcium: 8.8 mg/dL — ABNORMAL LOW (ref 8.9–10.3)
Chloride: 108 mmol/L (ref 98–111)
Creatinine, Ser: 0.62 mg/dL (ref 0.44–1.00)
GFR, Estimated: >60 mL/min (ref >60)
Glucose, Bld: 107 mg/dL — ABNORMAL HIGH (ref 70–99)
Potassium: 3.8 mmol/L (ref 3.5–5.1)
Sodium: 136 mmol/L (ref 135–145)
Total Bilirubin: 0.9 mg/dL (ref 0.3–1.2)
Total Protein: 7.4 g/dL (ref 6.5–8.1)

## 2021-05-01 LAB — RAPID URINE DRUG SCREEN, HOSP PERFORMED
Amphetamines: NOT DETECTED
Barbiturates: NOT DETECTED
Benzodiazepines: NOT DETECTED
Cocaine: NOT DETECTED
Opiates: NOT DETECTED
Tetrahydrocannabinol: NOT DETECTED

## 2021-05-01 LAB — CBC
HCT: 39.3 % (ref 36.0–46.0)
Hemoglobin: 12.4 g/dL (ref 12.0–15.0)
MCH: 27.7 pg (ref 26.0–34.0)
MCHC: 31.6 g/dL (ref 30.0–36.0)
MCV: 87.7 fL (ref 80.0–100.0)
Platelets: 239 10*3/uL (ref 150–400)
RBC: 4.48 MIL/uL (ref 3.87–5.11)
RDW: 16.5 % — ABNORMAL HIGH (ref 11.5–15.5)
WBC: 6.9 10*3/uL (ref 4.0–10.5)
nRBC: 0 % (ref 0.0–0.2)

## 2021-05-01 LAB — LIPID PANEL
Cholesterol: 263 mg/dL — ABNORMAL HIGH (ref 0–200)
HDL: 74 mg/dL (ref >40)
LDL Cholesterol: 176 mg/dL — ABNORMAL HIGH (ref 0–99)
Total CHOL/HDL Ratio: 3.6 RATIO
Triglycerides: 67 mg/dL (ref <150)
VLDL: 13 mg/dL (ref 0–40)

## 2021-05-01 NOTE — Consult Note (Signed)
NEUROLOGY CONSULTATION NOTE   Date of service: May 01, 2021 Patient Name: Margaret Avila MRN:  [id number] DOB:  12-04-1950 Reason for consult: "headache and speech difficulty" Requesting Provider: Dessa Phi, DO _ _ _   _ __   _ __ _ _  __ __   _ __   __ _  History of Present Illness  Margaret Avila is a 70 y.o. 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 Symptoms started abruptly at 1300 on 04/30/21. Headache was throbbing like a pressure over her forehead sith throbbing. Symptoms lasted 30-40 mins and then symptoms resolved. She reports her blood pressure was upto 230s at home. She took 3 tylenols with no relief so she called EMS.    She does endorse prior similar symptoms in the setting of high blood pressure. These started last month and she has had 3 episodes over the last month. The 2 prior episodes that she had were the exact same with bifrontal thrombbing headache and speech difficulty and feelinglightheaded with standing up. These have always occurred with blood pressure over [id number] systolic.  She was brought into the ED at [facility] and was transferred to [facility] as she did not endorse prior similar episode and this was initially thought to be a potential TIA.  Workup with MRI Brain without contrast with no acute abnormality, MR Angio head w/o contrast with no significant vessel stenosis. LDL was elevated to 176. HbA1c is pending. TTE with EF of 65-70% with moderate left ventricular hypertrophy and grade 1 diastolic dysfunction. Interatrial septum was not well visualized.    ROS   Constitutional Denies weight loss, fever and chills.   HEENT Denies changes in vision and hearing.   Respiratory Denies SOB and cough.   CV Denies palpitations and CP   GI Denies abdominal pain, nausea, vomiting and  diarrhea.   GU Denies dysuria and urinary frequency.   MSK Denies myalgia and joint pain.   Skin Denies rash and pruritus.   Neurological Denies headache and syncope.   Psychiatric Denies recent changes in mood. Denies anxiety and depression.    Past History   Past Medical History:  Diagnosis Date   Acid reflux    Breast cancer ([place]) Left breast cancer with lymph node removal. Completed Chemo 2016. due to start radiation 11/03/15. Treated by Dr Sherre Lain in [place] [facility])    DOE (dyspnea on exertion)    chronic   Dyspnea    chronic   Hiatal hernia    Hyperlipidemia    Hypertension    PE (pulmonary thromboembolism) ([place])    Vertigo    Past Surgical History:  Procedure Laterality Date   ABDOMINAL HYSTERECTOMY     TOTAL HIP ARTHROPLASTY     TOTAL KNEE ARTHROPLASTY     Family History  Problem Relation Age of Onset   Hypertension Mother    Hypertension Father    Hypertension Other    Social History   Socioeconomic History   Marital status: Divorced    Spouse name: Not on file   Number of children: Not on file   Years of education: Not on file   Highest education level: Not on file  Occupational History   Not on file  Tobacco Use   Smoking status: Never   Smokeless tobacco: Never  Substance and Sexual Activity   Alcohol use: No   Drug use: No   Sexual activity: Not Currently    Birth control/protection: Surgical  Other Topics Concern   Not on file  Social History Narrative   Not on file   Social Determinants of Health   Financial Resource Strain: Not on file  Food Insecurity: Not on file  Transportation Needs: Not on file  Physical Activity: Not on file  Stress: Not on file  Social Connections: Not on file   No Known Allergies  Medications   Medications Prior to Admission  Medication Sig Dispense Refill Last Dose   amLODipine (NORVASC) 10 MG tablet Take 10 mg by mouth daily.   04/30/2021   atorvastatin (LIPITOR) 40 MG tablet Take 40 mg by mouth  daily.   04/30/2021   atorvastatin (LIPITOR) 40 MG tablet Take 40 mg by mouth daily.   04/30/2021   capecitabine (XELODA) 500 MG tablet Take 1,000 mg/m2 by mouth 2 (two) times daily after a meal.   04/30/2021   losartan (COZAAR) 25 MG tablet Take 25 mg by mouth daily.   04/30/2021   metoprolol tartrate (LOPRESSOR) 25 MG tablet Take 25 mg by mouth daily.   04/30/2021 at 0700   rivaroxaban (XARELTO) 20 MG TABS tablet Start after 11/23/14. '20mg'$  po daily (Patient taking differently: 20 mg daily.) 30 tablet 4 04/30/2021 at 00700   amLODipine (NORVASC) 5 MG tablet Take 2 tablets (10 mg total) by mouth daily. (Patient not taking: Reported on 04/30/2021) 60 tablet 0 Not Taking   cloNIDine (CATAPRES) 0.2 MG tablet Take 0.2 mg by mouth 2 (two) times daily.  (Patient not taking: Reported on 04/30/2021)   Not Taking   gabapentin (NEURONTIN) 300 MG capsule Take 300 mg by mouth 3 (three) times daily. (Patient not taking: Reported on 04/30/2021)   Not Taking   hydrochlorothiazide (HYDRODIURIL) 25 MG tablet Take by mouth daily.  (Patient not taking: Reported on 04/30/2021)   Not Taking   olmesartan (BENICAR) 40 MG tablet 1 tablet daily. (Patient not taking: Reported on 04/30/2021)   Not Taking   oxybutynin (DITROPAN) 5 MG tablet Take 1 tablet (5 mg total) by mouth 2 (two) times daily. (Patient not taking: Reported on 04/30/2021) 60 tablet 3 Not Taking     Vitals   Vitals:   05/01/21 0649 05/01/21 0900 05/01/21 1240 05/01/21 1650  BP: (!) 175/106 (!) 159/96 (!) 159/91 (!) 184/89  Pulse: 93 94 86 87  Resp: '14 15 18 18  '$ Temp:  98.3 F (36.8 C) 98.2 F (36.8 C) 98.1 F (36.7 C)  TempSrc:  Oral Oral Oral  SpO2: 100% 98% 96% 97%  Weight:      Height:         Body mass index is 30.11 kg/m.  Physical Exam   General: Laying comfortably in bed; in no acute distress.  HENT: Normal oropharynx and mucosa. Normal external appearance of ears and nose.  Neck: Supple, no pain or tenderness  CV: No JVD. No peripheral edema.   Pulmonary: Symmetric Chest rise. Normal respiratory effort.  Abdomen: Soft to touch, non-tender.  Ext: No cyanosis, edema, or deformity  Skin: No rash. Normal palpation of skin.   Musculoskeletal: Normal digits and nails by inspection. No clubbing.   Neurologic Examination  Mental status/Cognition: Alert, oriented to self, place, month and year, good attention. Speech/language: Fluent, comprehension intact, object naming intact, repetition intact. Cranial nerves:   CN II Pupils equal and reactive to light,  no VF deficits   CN III,IV,VI EOM intact, no gaze preference or deviation, no nystagmus   CN V normal sensation in V1, V2, and V3 segments bilaterally   CN VII no asymmetry, no nasolabial fold flattening   CN VIII normal hearing to speech   CN IX & X normal palatal elevation, no uvular deviation   CN XI 5/5 head turn and 5/5 shoulder shrug bilaterally   CN XII midline tongue protrusion    Motor:  Muscle bulk: normal, tone normal, pronator drift none tremor none Mvmt Root Nerve  Muscle Right Left Comments  SA C5/6 Ax Deltoid 5 5   EF C5/6 Mc Biceps 5 5   EE C6/7/8 Rad Triceps 5 5   WF C6/7 Med FCR     WE C7/8 PIN ECU     F Ab C8/T1 U ADM/FDI 5 5   HF L1/2/3 Fem Illopsoas 5 5   KE L2/3/4 Fem Quad 5 5   DF L4/5 D Peron Tib Ant 5 5   PF S1/2 Tibial Grc/Sol 5 5    Reflexes:  Right Left Comments  Pectoralis      Biceps (C5/6) 2 2   Brachioradialis (C5/6) 2 2    Triceps (C6/7) 2 2    Patellar (L3/4) 0 0 Knee replacement BL.   Achilles (S1) 1 1    Hoffman      Plantar     Jaw jerk    Sensation:  Light touch intact   Pin prick    Temperature    Vibration   Proprioception    Coordination/Complex Motor:  - Finger to Nose intact - Heel to shin intact - Rapid alternating movement are intact. - Gait: Deferred.  Labs   CBC:  Recent Labs  Lab 04/30/21 1849 05/01/21 0411  WBC 9.1 6.9  NEUTROABS 6.9  --   HGB 11.5* 12.4  HCT 35.8* 39.3  MCV 86.9 87.7  PLT 243  [id number]    Basic Metabolic Panel:  Lab Results  Component Value Date   NA 136 05/01/2021   K 3.8 05/01/2021   CO2 23 05/01/2021   GLUCOSE 107 (H) 05/01/2021   BUN 11 05/01/2021   CREATININE 0.62 05/01/2021   CALCIUM 8.8 (L) 05/01/2021   GFRNONAA >60 05/01/2021   GFRAA >60 03/18/2017   Lipid Panel:  Lab Results  Component Value Date   LDLCALC 176 (H) 05/01/2021   HgbA1c: No results found for: HGBA1C Urine Drug Screen:     Component Value Date/Time   LABOPIA NONE DETECTED 04/30/2021 2337   [place] DETECTED 04/30/2021 2337   LABBENZ NONE DETECTED 04/30/2021 2337   AMPHETMU NONE DETECTED 04/30/2021 2337   THCU NONE DETECTED 04/30/2021 2337   LABBARB NONE DETECTED 04/30/2021 2337    Alcohol Level No results found for: [place]  CT Head without contrast: CTH was negative for a large hypodensity concerning for a large territory infarct or hyperdensity concerning for an ICH  MR Angio head without contrast: Mild intracranial atherosclerosis.  MRI Brain: No acute infarct.  Impression   Margaret Avila is a 70 y.o. 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 Initially thought to be TIA but she endorses atleast 2 prior identical episodes and everytime, her blood pressure has been above 200s at the onset. This time, she  tried tylenol x 3 with no relief so she called EMS. The symptoms lasted 30-4 mins and resolved. Given that she has had prior identical episode, I do not think that this is TIA. It would be very rare for recurrent TIA to present with the exact same symptoms all the time. The duration of the episode and the symptomatology does not fit a seizure. I suspect that these episodes are from hypertensive emergency given her blood pressure is always elevated during these episodes.  Hypertension can result in loss of cerebral autoregulation with poor perfusion despite significantly elevated blood pressure, it also results in damage to the blood brain barrier with resultant confusion, headaches.  Recommendations  - I would recommend gradual normotension, we should attempt to bring her blood pressure down by no more than 20-2% in a given 24 hours period until normotension is achieved. She would benefit greatly from close follow up with her PCP for close BP monitoring. I discussed with her that she should log and track her blood pressure at home. - I increased her Atorvastatin to '80mg'$  daily from her current dose of '40mg'$  daily. She reports prior hx of TIA/mini stroke about 3 years ago and goal LDL for her should be less than 70. - Neurology inpatient team will signoff. Please feel free to contact us with any questions or concerns. ___________________________________________________________________  Plan discussed with Dr. Ninetta Lights and requested to sign this out to the AM hospitalist team.  Thank you for the opportunity to take part in the care of this patient. If you have any further questions, please contact the neurology consultation attending.  Signed,  [place] Pager Number [phone] _ _ _   _ __   _ __ _ _  __ __   _ __   __ _

## 2021-05-01 NOTE — Progress Notes (Signed)
  Echocardiogram 2D Echocardiogram has been performed.  Margaret Avila 05/01/2021, 1:46 PM

## 2021-05-01 NOTE — Evaluation (Signed)
Physical Therapy Evaluation Patient Details Name: Margaret Avila MRN: [id number] DOB: 1951-06-12 Today's Date: 05/01/2021   History of Present Illness  70 y.o. female presented to the ED with a chief complaint of high blood pressure, difficulty speaking, headache, and dizziness. CT head and MRI brain show no acute intracranial process  PMH--GERD, breast cancer, HLD, HTN, history of PE  Clinical Impression   Pt admitted secondary to problem above with deficits below. PTA patient was living alone and walking with a cane. She reports multiple falls per week; mostly backwards but can fall in any direction. She denies dizziness or feeling pushed/pulled. She states she just "loses (her) balance." Pt currently requires minguard assist with education on use of RW. Pt with no overt loss of balance during assessment. 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 She was thankful to learn to use a RW during session and can benefit from additional education and balance training.  Anticipate patient will benefit from PT to address problems listed below.Will continue to follow acutely to maximize functional mobility independence and safety.       Follow Up Recommendations Home health PT    Equipment Recommendations  Rolling walker with 5" wheels    Recommendations for Other Services       Precautions / Restrictions Precautions Precautions: Fall Precaution Comments: pt reports she fell 7 times last week      Mobility  Bed Mobility Overal bed mobility: Independent                  Transfers Overall transfer level: Needs assistance Equipment used: Rolling walker (2 wheeled);None Transfers: Sit to/from Stand Sit to Stand: Supervision         General transfer comment: no device without imbalance, however educated on use of RW as she sometimes falls when trying to get up out of her chair (tends to fall  backwards and land on chair)  Ambulation/Gait Ambulation/Gait assistance: Min guard Gait Distance (Feet): 150 Feet (seated rest; 120 with RW) Assistive device: Rolling walker (2 wheeled);1 person hand held assist Gait Pattern/deviations: Step-through pattern;Decreased stride length     General Gait Details: simulated cane with HHA during first walk with slight unsteadiness noted (no particular direction, no staggering); educated on use of RW during second walk and pt quickly maintaining proper distance to RW, safe use during turns  Financial trader Rankin (Stroke Patients Only)       Balance Overall balance assessment: Needs assistance;History of Falls Sitting-balance support: No upper extremity supported;Feet supported Sitting balance-Leahy Scale: Good     Standing balance support: No upper extremity supported Standing balance-Leahy Scale: Fair           Rhomberg - Eyes Opened: 30 (slight incr sway laterally) Rhomberg - Eyes Closed: 10 (increased drift to her right) High level balance activites: Backward walking;Direction changes;Turns;Other (comment) (reaching) High Level Balance Comments: pt only able to confidently reach 4" anteriorly with dominant hand in standing; imblance noted with turns (corrected when using RW)             Pertinent Vitals/Pain Pain Assessment: No/denies pain    Home Living Family/patient expects to be discharged to:: Private residence Living Arrangements: Alone Available Help at Discharge: Family;Friend(s);Available PRN/intermittently Type of Home: House Home Access: Stairs to enter     Home Layout: One  level Home Equipment: Walker - standard;Cane - single point      Prior Function Level of Independence: Independent         Comments: but with multiple falls per week; states often falls backwards, but always just loses her balance and then falls; denies sense of being pulled or pushed;  denies dizziness     Hand Dominance        Extremity/Trunk Assessment   Upper Extremity Assessment Upper Extremity Assessment: Defer to OT evaluation    Lower Extremity Assessment Lower Extremity Assessment: Generalized weakness;RLE deficits/detail;LLE deficits/detail (symmetrical strength; 4+/5 knees and ankles) RLE Sensation: history of peripheral neuropathy LLE Sensation: history of peripheral neuropathy    Cervical / Trunk Assessment Cervical / Trunk Assessment: Normal  Communication   Communication: No difficulties (talks fast and at times difficult to understand)  Cognition Arousal/Alertness: Awake/alert Behavior During Therapy: WFL for tasks assessed/performed Overall Cognitive Status: Within Functional Limits for tasks assessed                                        General Comments General comments (skin integrity, edema, etc.): abbreviated vestibular evaluation completed with ?rt hypofunction noted with repeated head impulse test. Pt denied dizziness with head impulse test or with VORx1 exercise, which would be unusual for a pt with a hypofunction. Smooth pursuits WNL    Exercises     Assessment/Plan    PT Assessment Patient needs continued PT services  PT Problem List Decreased strength;Decreased balance;Decreased mobility;Decreased knowledge of use of DME;Decreased safety awareness;Decreased knowledge of precautions;Impaired sensation       PT Treatment Interventions DME instruction;Gait training;Stair training;Functional mobility training;Therapeutic activities;Therapeutic exercise;Balance training;Patient/family education    PT Goals (Current goals can be found in the Care Plan section)  Acute Rehab PT Goals Patient Stated Goal: obtain a walker to help her stop falling PT Goal Formulation: With patient Time For Goal Achievement: 05/15/21 Potential to Achieve Goals: Good    Frequency Min 3X/week   Barriers to discharge Decreased  caregiver support      Co-evaluation               AM-PAC PT "6 Clicks" Mobility  Outcome Measure Help needed turning from your back to your side while in a flat bed without using bedrails?: None Help needed moving from lying on your back to sitting on the side of a flat bed without using bedrails?: None Help needed moving to and from a bed to a chair (including a wheelchair)?: A Little Help needed standing up from a chair using your arms (e.g., wheelchair or bedside chair)?: A Little Help needed to walk in hospital room?: A Little Help needed climbing 3-5 steps with a railing? : A Little 6 Click Score: 20    End of Session Equipment Utilized During Treatment: Gait belt Activity Tolerance: Patient tolerated treatment well Patient left: in bed;with call bell/phone within reach;with bed alarm set Nurse Communication: Mobility status;Other (comment) (frequent falls; encourage use of RW during gait) PT Visit Diagnosis: Unsteadiness on feet (R26.81);Repeated falls (R29.6)    Time: [id number] PT Time Calculation (min) (ACUTE ONLY): 34 min   Charges:   PT Evaluation $PT Eval Moderate Complexity: 1 Mod PT Treatments $Gait Training: 8-22 mins         Arby Barrette, PT Pager [phone]   Rexanne Mano 05/01/2021, 5:43 PM

## 2021-05-01 NOTE — Progress Notes (Signed)
PROGRESS NOTE    Patient: Margaret Avila                            PCP: Vesta Mixer                    DOB: 21-Jul-1951            DOA: 04/30/2021 [id number]             DOS: 05/01/2021, 7:29 AM   LOS: 0 days   Date of Service: The patient was seen and examined on 05/01/2021  Subjective:   The patient was seen and examined this morning. Stable at this time. Still complaining headaches, but reporting improved speech dizziness Otherwise no issues overnight .  Brief Narrative:  Per HPI: Margaret Avila  is a 70 y.o. female,GERD, breast cancer, HLD, HTN, history of PE, and more presents to the ED with a chief complaint of high blood pressure. Patient reports that at 1 pm on 7/23 she had sudden onset of difficulty speaking, headache, and dizziness.  Patient describes her headache as pressure, throbbing, located over the forehead.  She had no blurry vision.  She describes her dizziness as "feeling like she was swimming."  She does clarify that she did not have asymmetric weakness.  She does not really understand questions regarding proprioception.  Patient reports that she had no difficulty swallowing, and no weakness in her extremities. She reports that she has not had a CVA or any symptoms like this in the past. She reports that the symptoms lasted approx 30-40 mins. She is not sure what provoked or relieved the symptoms. Patient reports that she is back to her baseline at this time.   ED:  Temp 99.3, heart rate 75-1 02, respiratory 11-24, blood pressure 142/115, satting at 100% Patient's blood pressure had been as high as 202/97, 196/105 CT head shows no acute intracranial process  Neurology was consulted in ED; recommends admission to [facility] for TIA work-up.   They did not recommend Plavix or aspirin because patient is already on Xarelto.   They did recommend permissive hypertension at 220/110.   Assessment & Plan:   Active Problems:   Hypertension   Breast cancer  ([place])   Multiple pulmonary emboli ([facility])   TIA (transient ischemic attack)   Hyperlipidemia   TIA -Ruling out stroke -Presenting with dysarthria, headaches, dizziness, lasting 30-40 minutes -negative for any other focal neurological findings or asymmetric weaknesses, improving -Hypertensive urgency on admission blood pressure as high as 202/97 >>> allowing permissive hypertension, bringing blood pressure down slowly -Overnight on admission neurology was consulted, recommended permissive hypertension, admission to [facility] for further neurological work-up TIA, ruling out stroke -We will follow-up with work-up, - MRI of the head,  -2D echocardiogram, -Labs, lipid panel A1c, -Standing from Plavix or aspirin patient continues to be on Xarelto -Continue high-dose statins -Monitoring on telemetry closely for any dysrhythmias   Hypokalemia Replace and recheck Monitoring closely Hypertensive crisis TIA (CVA) Allowing permissive hypertension at this time  Holding home medications including ARB, clonidine, metoprolol, and amlodipine We will continue with low-dose hydralazine for blood pressure greater than 220/110 Reducing blood pressure slowly after 48 hours History of PE Continue Xarelto Stable denies any chest pain, no signs of shortness of breath or hypoxia Monitoring History of breast cancer Continue Xeloda Hyperlipidemia Continue statin    ----------------------------------------------------------------------------------------------------------------------------------------------- Nutritional status:  The patient's BMI is: Body  mass index is 30.11 kg/m. I agree with the assessment and plan as outlined   --------------------------------------------------------------------------------------------------------------------------------------------- Cultures; Nonwe  Antimicrobials: None    Consultants: Neurology at [facility] was called  overnight   ------------------------------------------------------------------------------------------------------------------------------------------------  DVT prophylaxis:  [id number]  Code Status:   Code Status: Full Code  Family Communication: No family member present at bedside- attempt will be made to update daily The above findings and plan of care has been discussed with patient (and family)  in detail,  they expressed understanding and agreement of above. -Advance care planning has been discussed.   Admission status:   Status is: Observation  The patient will require care spanning > 2 midnights and should be moved to inpatient because: Inpatient level of care appropriate due to severity of illness  Dispo: The patient is from: Home              Anticipated d/c is to: Home  Patient currently is not medically stable to d/c.  Is requiring continue close monitoring for TIA versus stroke, and work-up in progress   Difficult to place patient No      Level of care: Telemetry Medical   Procedures:   No admission procedures for hospital encounter.    Antimicrobials:  Anti-infectives (From admission, onward)    None        Medication:    stroke: mapping our early stages of recovery book   Does not apply Once   atorvastatin  40 mg Oral Daily   capecitabine  1,000 mg/m2 Oral BID PC   rivaroxaban  20 mg Oral QPC breakfast    acetaminophen **OR** acetaminophen (TYLENOL) oral liquid 160 mg/5 mL **OR** acetaminophen, hydrALAZINE, senna-docusate   Objective:   Vitals:   05/01/21 0300 05/01/21 0400 05/01/21 0510 05/01/21 0649  BP: (!) 160/98 (!) 150/112 (!) 174/103 (!) 175/106  Pulse:   89 93  Resp: (!) 29 (!) 24 (!) 21 14  Temp:   98.8 F (37.1 C)   TempSrc:   Oral   SpO2:   99% 100%  Weight:      Height:       No intake or output data in the 24 hours ending 05/01/21 0729 Filed Weights   04/30/21 1751  Weight: 77.1 kg     Examination:   Physical  Exam  Constitution:  Alert, cooperative, no distress,  Appears calm and comfortable  Psychiatric: Normal and stable mood and affect, cognition intact,   HEENT: Normocephalic, PERRL, otherwise with in Normal limits  Chest:Chest symmetric Cardio vascular:  S1/S2, RRR, No murmure, No Rubs or Gallops  pulmonary: Clear to auscultation bilaterally, respirations unlabored, negative wheezes / crackles Abdomen: Soft, non-tender, non-distended, bowel sounds,no masses, no organomegaly Muscular skeletal: Limited exam - in bed, able to move all 4 extremities, Normal strength,  Neuro: CNII-XII intact. , normal motor and sensation, reflexes intact  Extremities: No pitting edema lower extremities, +2 pulses  Skin: Dry, warm to touch, negative for any Rashes, No open wounds Wounds: per nursing documentation    ------------------------------------------------------------------------------------------------------------------------------------------    LABs:  CBC Latest Ref Rng & Units 05/01/2021 04/30/2021 04/15/2021  WBC 4.0 - 10.5 K/uL 6.9 9.1 5.1  Hemoglobin 12.0 - 15.0 g/dL 12.4 11.5(L) 12.2  Hematocrit 36.0 - 46.0 % 39.3 35.8(L) 39.6  Platelets 150 - 400 K/uL 239 243 183   CMP Latest Ref Rng & Units 05/01/2021 04/30/2021 04/15/2021  Glucose 70 - 99 mg/dL 107(H) 107(H) 102(H)  BUN 8 - 23 mg/dL 11 15 13  Creatinine 0.44 - 1.00 mg/dL 0.62 0.76 0.73  Sodium 135 - 145 mmol/L 136 135 138  Potassium 3.5 - 5.1 mmol/L 3.8 3.2(L) 4.0  Chloride 98 - 111 mmol/L 108 104 104  CO2 22 - 32 mmol/L '23 24 25  '$ Calcium 8.9 - 10.3 mg/dL 8.8(L) 8.7(L) 9.0  Total Protein 6.5 - 8.1 g/dL 7.4 - 7.7  Total Bilirubin 0.3 - 1.2 mg/dL 0.9 - 0.8  Alkaline Phos 38 - 126 U/L 118 - 127(H)  AST 15 - 41 U/L 16 - 20  ALT 0 - 44 U/L 10 - 14       Micro Results Recent Results (from the past 240 hour(s))  Resp Panel by RT-PCR (Flu A&B, Covid) Nasopharyngeal Swab     Status: None   Collection Time: 04/30/21  9:04 PM   Specimen:  Nasopharyngeal Swab; Nasopharyngeal(NP) swabs in vial transport medium  Result Value Ref Range Status   SARS Coronavirus 2 by RT PCR NEGATIVE NEGATIVE Final    Comment: (NOTE) SARS-CoV-2 target nucleic acids are NOT DETECTED.  The SARS-CoV-2 RNA is generally detectable in upper respiratory specimens during the acute phase of infection. The lowest concentration of SARS-CoV-2 viral copies this assay can detect is 138 copies/mL. A negative result does not preclude SARS-Cov-2 infection and should not be used as the sole basis for treatment or other patient management decisions. A negative result may occur with  improper specimen collection/handling, submission of specimen other than nasopharyngeal swab, presence of viral mutation(s) within the areas targeted by this assay, and inadequate number of viral copies(<138 copies/mL). A negative result must be combined with clinical observations, patient history, and epidemiological information. The expected result is Negative.  Fact Sheet for Patients:  EntrepreneurPulse.com.au  Fact Sheet for Healthcare Providers:  IncredibleEmployment.be  This test is no t yet approved or cleared by the [place] FDA and  has been authorized for detection and/or diagnosis of SARS-CoV-2 by FDA under an Emergency Use Authorization (EUA). This EUA will remain  in effect (meaning this test can be used) for the duration of the COVID-19 declaration under Section 564(b)(1) of the Act, 21 U.S.C.section 360bbb-3(b)(1), unless the authorization is terminated  or revoked sooner.       Influenza A by PCR NEGATIVE NEGATIVE Final   Influenza B by PCR NEGATIVE NEGATIVE Final    Comment: (NOTE) The Xpert Xpress SARS-CoV-2/FLU/RSV plus assay is intended as an aid in the diagnosis of influenza from Nasopharyngeal swab specimens and should not be used as a sole basis for treatment. Nasal washings and aspirates are unacceptable for  Xpert Xpress SARS-CoV-2/FLU/RSV testing.  Fact Sheet for Patients: EntrepreneurPulse.com.au  Fact Sheet for Healthcare Providers: IncredibleEmployment.be  This test is not yet approved or cleared by the [place] FDA and has been authorized for detection and/or diagnosis of SARS-CoV-2 by FDA under an Emergency Use Authorization (EUA). This EUA will remain in effect (meaning this test can be used) for the duration of the COVID-19 declaration under Section 564(b)(1) of the Act, 21 U.S.C. section 360bbb-3(b)(1), unless the authorization is terminated or revoked.  Performed at [facility], [address] [place], [place] [address]     Radiology Reports DG Chest 1 View  Result Date: 04/15/2021 CLINICAL DATA:  Dizziness and high blood pressure. EXAM: CHEST  1 VIEW COMPARISON:  03/18/2017 FINDINGS: 1141 hours. The lungs are clear without focal pneumonia, edema, pneumothorax or pleural effusion. The cardiopericardial silhouette is within normal limits for size. Right Port-A-Cath again noted. The visualized  bony structures of the thorax show no acute abnormality. Telemetry leads overlie the chest. IMPRESSION: No active disease. Electronically Signed   By: Misty Stanley M.D.   On: 04/15/2021 12:29   CT Head Wo Contrast  Result Date: 04/30/2021 CLINICAL DATA:  Headache and hypertension. History of breast cancer. EXAM: CT HEAD WITHOUT CONTRAST TECHNIQUE: Contiguous axial images were obtained from the base of the skull through the vertex without intravenous contrast. COMPARISON:  CT head dated 04/15/2021. FINDINGS: Brain: No evidence of acute infarction, hemorrhage, hydrocephalus, extra-axial collection or mass lesion/mass effect. Periventricular white matter hypoattenuation likely represents chronic small vessel ischemic disease. There is mild cerebral volume loss with associated ex vacuo dilatation. Vascular: There are vascular calcifications in the carotid  siphons. Skull: Normal. Negative for fracture or focal lesion. Sinuses/Orbits: No acute finding. Other: None. IMPRESSION: No acute intracranial process. Electronically Signed   By: Zerita Boers M.D.   On: 04/30/2021 19:04   CT Head Wo Contrast  Result Date: 04/15/2021 CLINICAL DATA:  Dizziness, hypertension EXAM: CT HEAD WITHOUT CONTRAST TECHNIQUE: Contiguous axial images were obtained from the base of the skull through the vertex without intravenous contrast. COMPARISON:  03/18/2017 FINDINGS: Brain: No evidence of acute infarction, hemorrhage, hydrocephalus, extra-axial collection or mass lesion/mass effect. Mild-moderate low-density changes within the periventricular and subcortical white matter compatible with chronic microvascular ischemic change. Mild diffuse cerebral volume loss. Vascular: Atherosclerotic calcifications involving the large vessels of the skull base. No unexpected hyperdense vessel. Skull: Normal. Negative for fracture or focal lesion. Sinuses/Orbits: No acute finding. Other: None. IMPRESSION: 1. No acute intracranial findings. 2. Chronic microvascular ischemic change and cerebral volume loss. Electronically Signed   By: Davina Poke D.O.   On: 04/15/2021 12:36    SIGNED: Deatra James, MD, FHM. [facility],  Pager (please use [email or web address] to page/text) Please use Epic Secure Chat for non-urgent communication (7AM-7PM)  If 7PM-7AM, please contact night-coverage [email or web address], 05/01/2021, 7:29 AM

## 2021-05-01 NOTE — Progress Notes (Signed)
PT Cancellation Note  Patient Details Name: Margaret Avila MRN: [id number] DOB: 1950/12/03   Cancelled Treatment:    Reason Eval/Treat Not Completed: Patient at procedure or test/unavailable  Patient undergoing echo. Will attempt to return later today as schedule permits.   Arby Barrette, PT Pager [phone]  [name] [name] 05/01/2021, 2:08 PM

## 2021-05-01 NOTE — ED Notes (Signed)
Called 4E per staff nurse Ronalee Belts is unavailable, unit given phone number to call for report here.

## 2021-05-01 NOTE — ED Notes (Signed)
Pt ambulatory to restroom. Urine sample not obtained

## 2021-05-01 NOTE — H&P (Signed)
TRH H&P    Patient Demographics:    Margaret Avila, is a 70 y.o. female  MRN: [id number]  DOB - 1950/10/28  Admit Date - 04/30/2021  Referring MD/NP/PA: Ileene Patrick  Outpatient Primary MD for the patient is Baucom, Lennon Alstrom, PA-C  Patient coming from: Home  Chief complaint- high blood pressure   HPI:    Margaret Avila  is a 70 y.o. female,GERD, breast cancer, HLD, HTN, history of PE, and more presents to the ED with a chief complaint of high blood pressure. Patient reports that at 1 pm on 7/23 she had sudden onset of difficulty speaking, headache, and dizziness.  Patient describes her headache as pressure, throbbing, located over the forehead.  She had no blurry vision.  She describes her dizziness as "feeling like she was swimming."  She does clarify that she did not have asymmetric weakness.  She does not really understand questions regarding proprioception.  Patient reports that she had no difficulty swallowing, and no weakness in her extremities. She reports that she has not had a CVA or any symptoms like this in the past. She reports that the symptoms lasted approx 30-40 mins. She is not sure what provoked or relieved the symptoms. Patient reports that she is back to her baseline at this time.   Patient reports that she does not smoke, does not drink alcohol, does not use illicit drugs.  She is vaccinated for COVID.  Patient is full code.  In the ED Temp 99.3, heart rate 75-1 02, respiratory 11-24, blood pressure 142/115, satting at 100% Patient's blood pressure had been as high as 202/97, 196/105 CT head shows no acute intracranial process Hydralazine, Reglan, Tylenol, normal saline given in the ER 500 mL bolus Patient initially just came in complaining of high blood pressure, and did not mention focal neuro deficits.  Was only upon discharge that she mentioned that she was having difficulty speaking at home.  So  at that time neurology was consulted and recommends admission to [facility] for TIA work-up.  They did not recommend Plavix or aspirin because patient is already on Xarelto.  They did recommend permissive hypertension at 220/110.  Admission to requested for TIA work-up      Review of systems:    In addition to the HPI above,  No Fever-chills, Headache, No changes with Vision or hearing, Admits to difficulty speaking, dizziness No Chest pain, Cough or Shortness of Breath, No Abdominal pain, No Nausea or Vomiting, bowel movements are regular, No Blood in stool or Urine, No dysuria, No new skin rashes or bruises, No new joints pains-aches,  No new weakness, tingling, numbness in any extremity, No recent weight gain or loss, No polyuria, polydypsia or polyphagia, No significant Mental Stressors.  All other systems reviewed and are negative.    Past History of the following :    Past Medical History:  Diagnosis Date   Acid reflux    Breast cancer ([place]) Left breast cancer with lymph node removal. Completed Chemo 2016. due to start radiation 11/03/15. Treated by Dr  Lahti in [facility])    DOE (dyspnea on exertion)    chronic   Dyspnea    chronic   Hiatal hernia    Hyperlipidemia    Hypertension    PE (pulmonary thromboembolism) ([place])    Vertigo       Past Surgical History:  Procedure Laterality Date   ABDOMINAL HYSTERECTOMY     TOTAL HIP ARTHROPLASTY     TOTAL KNEE ARTHROPLASTY        Social History:      Social History   Tobacco Use   Smoking status: Never   Smokeless tobacco: Never  Substance Use Topics   Alcohol use: No       Family History :     Family History  Problem Relation Age of Onset   Hypertension Mother    Hypertension Father    Hypertension Other       Home Medications:   Prior to Admission medications   Medication Sig Start Date End Date Taking? Authorizing Provider  amLODipine (NORVASC) 10 MG tablet Take 10 mg by  mouth daily.   Yes [provider]  atorvastatin (LIPITOR) 40 MG tablet Take 40 mg by mouth daily.   Yes [provider]  atorvastatin (LIPITOR) 40 MG tablet Take 40 mg by mouth daily. 12/07/16  Yes [provider]  capecitabine (XELODA) 500 MG tablet Take 1,000 mg/m2 by mouth 2 (two) times daily after a meal.   Yes [provider]  losartan (COZAAR) 25 MG tablet Take 25 mg by mouth daily. 04/27/21  Yes [provider]  metoprolol tartrate (LOPRESSOR) 25 MG tablet Take 25 mg by mouth daily. 01/19/21  Yes [provider]  rivaroxaban (XARELTO) 20 MG TABS tablet Start after 11/23/14. '20mg'$  po daily Patient taking differently: 20 mg daily. 10/25/15  Yes Annita Brod, MD  amLODipine (NORVASC) 5 MG tablet Take 2 tablets (10 mg total) by mouth daily. Patient not taking: Reported on 04/30/2021 09/13/16   Mesner, Corene Cornea, MD  cloNIDine (CATAPRES) 0.2 MG tablet Take 0.2 mg by mouth 2 (two) times daily.  Patient not taking: Reported on 04/30/2021    [provider]  gabapentin (NEURONTIN) 300 MG capsule Take 300 mg by mouth 3 (three) times daily. Patient not taking: Reported on 04/30/2021    [provider]  hydrochlorothiazide (HYDRODIURIL) 25 MG tablet Take by mouth daily.  Patient not taking: Reported on 04/30/2021    [provider]  olmesartan (BENICAR) 40 MG tablet 1 tablet daily. Patient not taking: Reported on 04/30/2021 08/14/16   [provider]  oxybutynin (DITROPAN) 5 MG tablet Take 1 tablet (5 mg total) by mouth 2 (two) times daily. Patient not taking: Reported on 04/30/2021 08/30/16   Jonnie Kind, MD     Allergies:    No Known Allergies   Physical Exam:   Vitals  Blood pressure (!) 168/95, pulse 98, temperature 99.3 F (37.4 C), temperature source Oral, resp. rate 19, height '5\' 3"'$  (1.6 m), weight 77.1 kg, SpO2 100 %.  1.  General: Patient lying supine in bed,  no acute distress   2.  Psychiatric: Alert and oriented x 3, mood and behavior normal for situation, pleasant and cooperative with exam   3. Neurologic: Speech and language are normal, face is symmetric, moves all 4 extremities voluntarily, equal strength in upper and lower extremities bilaterally, equal sensation in upper and lower extremities bilaterally, at baseline without acute deficits on limited exam  4. HEENMT:  Head is atraumatic, normocephalic, pupils reactive to light, neck is supple, trachea is midline, mucous membranes are moist   5. Respiratory : Lungs are clear to auscultation bilaterally without wheezing, rhonchi, rales, no cyanosis, no increase in work of breathing or accessory muscle use   6. Cardiovascular : Heart rate normal, rhythm is regular, systolic murmur present-patient reports that she was unaware of said murmur, rubs or gallops, no peripheral edema, peripheral pulses palpated   7. Gastrointestinal:  Abdomen is soft, nondistended, nontender to palpation bowel sounds active, no masses or organomegaly palpated   8. Skin:  Skin is warm, dry and intact without rashes, acute lesions, or ulcers on limited exam   9.Musculoskeletal:  No acute deformities or trauma, no asymmetry in tone, no peripheral edema, peripheral pulses palpated, no tenderness to palpation in the extremities     Data Review:    CBC Recent Labs  Lab 04/30/21 1849  WBC 9.1  HGB 11.5*  HCT 35.8*  PLT 243  MCV 86.9  MCH 27.9  MCHC 32.1  RDW 16.3*  LYMPHSABS 1.4  MONOABS 0.8  EOSABS 0.0  BASOSABS 0.0   ------------------------------------------------------------------------------------------------------------------  Results for orders placed or performed during the hospital encounter of 04/30/21 (from the past 48 hour(s))  Basic metabolic panel     Status: Abnormal   Collection Time: 04/30/21  6:49 PM  Result Value Ref Range   Sodium 135 135 - 145 mmol/L   Potassium 3.2 (L) 3.5 - 5.1 mmol/L   Chloride  104 98 - 111 mmol/L   CO2 24 22 - 32 mmol/L   Glucose, Bld 107 (H) 70 - 99 mg/dL    Comment: Glucose reference range applies only to samples taken after fasting for at least 8 hours.   BUN 15 8 - 23 mg/dL   Creatinine, Ser 0.76 0.44 - 1.00 mg/dL   Calcium 8.7 (L) 8.9 - 10.3 mg/dL   GFR, Estimated >60 >60 mL/min    Comment: (NOTE) Calculated using the CKD-EPI Creatinine Equation (2021)    Anion gap 7 5 - 15    Comment: Performed at [facility], [address] [place], [place] [address]  CBC with Differential     Status: Abnormal   Collection Time: 04/30/21  6:49 PM  Result Value Ref Range   WBC 9.1 4.0 - 10.5 K/uL   RBC 4.12 3.87 - 5.11 MIL/uL   Hemoglobin 11.5 (L) 12.0 - 15.0 g/dL   HCT 35.8 (L) 36.0 - 46.0 %   MCV 86.9 80.0 - 100.0 fL   MCH 27.9 26.0 - 34.0 pg   MCHC 32.1 30.0 - 36.0 g/dL   RDW 16.3 (H) 11.5 - 15.5 %   Platelets 243 150 - 400 K/uL   nRBC 0.0 0.0 - 0.2 %   Neutrophils Relative % 76 %   Neutro Abs 6.9 1.7 - 7.7 K/uL   Lymphocytes Relative 15 %   Lymphs Abs 1.4 0.7 - 4.0 K/uL   Monocytes Relative 9 %   Monocytes Absolute 0.8 0.1 - 1.0 K/uL   Eosinophils Relative 0 %   Eosinophils Absolute 0.0 0.0 - 0.5 K/uL   Basophils Relative 0 %   Basophils Absolute 0.0 0.0 - 0.1 K/uL   Immature Granulocytes 0 %   Abs Immature Granulocytes 0.03 0.00 - 0.07 K/uL    Comment: Performed at [facility], [address], [place], [place] [address]  Protime-INR     Status: Abnormal   Collection Time: 04/30/21  6:49 PM  Result Value Ref Range   Prothrombin Time 28.0 (H) 11.4 - 15.2 seconds   INR 2.6 (H) 0.8 - 1.2    Comment: (NOTE) INR goal varies based on device and disease states. Performed at [facility], [address] [place], [place] [address]   APTT     Status: Abnormal   Collection Time: 04/30/21  6:49 PM  Result Value Ref Range   aPTT 37 (H) 24 - 36 seconds    Comment:        IF BASELINE aPTT IS ELEVATED, SUGGEST PATIENT RISK ASSESSMENT BE USED TO DETERMINE  APPROPRIATE ANTICOAGULANT THERAPY. Performed at [facility], [address], [place], [place] [address]   Resp Panel by RT-PCR (Flu A&B, Covid) Nasopharyngeal Swab     Status: None   Collection Time: 04/30/21  9:04 PM   Specimen: Nasopharyngeal Swab; Nasopharyngeal(NP) swabs in vial transport medium  Result Value Ref Range   SARS Coronavirus 2 by RT PCR NEGATIVE NEGATIVE    Comment: (NOTE) SARS-CoV-2 target nucleic acids are NOT DETECTED.  The SARS-CoV-2 RNA is generally detectable in upper respiratory specimens during the acute phase of infection. The lowest concentration of SARS-CoV-2 viral copies this assay can detect is 138 copies/mL. A negative result does not preclude SARS-Cov-2 infection and should not be used as the sole basis for treatment or other patient management decisions. A negative result may occur with  improper specimen collection/handling, submission of specimen other than nasopharyngeal swab, presence of viral mutation(s) within the areas targeted by this assay, and inadequate number of viral copies(<138 copies/mL). A negative result must be combined with clinical observations, patient history, and epidemiological information. The expected result is Negative.  Fact Sheet for Patients:  EntrepreneurPulse.com.au  Fact Sheet for Healthcare Providers:  IncredibleEmployment.be  This test is no t yet approved or cleared by the [place] FDA and  has been authorized for detection and/or diagnosis of SARS-CoV-2 by FDA under an Emergency Use Authorization (EUA). This EUA will remain  in effect (meaning this test can be used) for the duration of the COVID-19 declaration under Section 564(b)(1) of the Act, 21 U.S.C.section 360bbb-3(b)(1), unless the authorization is terminated  or revoked sooner.       Influenza A by PCR NEGATIVE NEGATIVE   Influenza B by PCR NEGATIVE NEGATIVE    Comment: (NOTE) The Xpert Xpress  SARS-CoV-2/FLU/RSV plus assay is intended as an aid in the diagnosis of influenza from Nasopharyngeal swab specimens and should not be used as a sole basis for treatment. Nasal washings and aspirates are unacceptable for Xpert Xpress SARS-CoV-2/FLU/RSV testing.  Fact Sheet for Patients: EntrepreneurPulse.com.au  Fact Sheet for Healthcare Providers: IncredibleEmployment.be  This test is not yet approved or cleared by the [place] FDA and has been authorized for detection and/or diagnosis of SARS-CoV-2 by FDA under an Emergency Use Authorization (EUA). This EUA will remain in effect (meaning this test can be used) for the duration of the COVID-19 declaration under Section 564(b)(1) of the Act, 21 U.S.C. section 360bbb-3(b)(1), unless the authorization is terminated or revoked.  Performed at [facility], [address], [place], [place] [address]     Chemistries  Recent Labs  Lab 04/30/21 1849  NA 135  K 3.2*  CL 104  CO2 24  GLUCOSE 107*  BUN 15  CREATININE 0.76  CALCIUM 8.7*   ------------------------------------------------------------------------------------------------------------------  ------------------------------------------------------------------------------------------------------------------ GFR: Estimated Creatinine Clearance: 62.5 mL/min (by C-G formula based on SCr of 0.76 mg/dL). Liver Function Tests: No  results for input(s): AST, ALT, ALKPHOS, BILITOT, PROT, ALBUMIN in the last 168 hours. No results for input(s): LIPASE, AMYLASE in the last 168 hours. No results for input(s): AMMONIA in the last 168 hours. Coagulation Profile: Recent Labs  Lab 04/30/21 1849  INR 2.6*   Cardiac Enzymes: No results for input(s): CKTOTAL, CKMB, CKMBINDEX, TROPONINI in the last 168 hours. BNP (last 3 results) No results for input(s): PROBNP in the last 8760 hours. HbA1C: No results for input(s): HGBA1C in the last 72  hours. CBG: No results for input(s): GLUCAP in the last 168 hours. Lipid Profile: No results for input(s): CHOL, HDL, LDLCALC, TRIG, CHOLHDL, LDLDIRECT in the last 72 hours. Thyroid Function Tests: No results for input(s): TSH, T4TOTAL, FREET4, T3FREE, THYROIDAB in the last 72 hours. Anemia Panel: No results for input(s): VITAMINB12, FOLATE, FERRITIN, TIBC, IRON, RETICCTPCT in the last 72 hours.  --------------------------------------------------------------------------------------------------------------- Urine analysis:    Component Value Date/Time   COLORURINE YELLOW 04/15/2021 1210   APPEARANCEUR CLEAR 04/15/2021 1210   LABSPEC 1.012 04/15/2021 1210   PHURINE 7.0 04/15/2021 1210   GLUCOSEU NEGATIVE 04/15/2021 1210   HGBUR NEGATIVE 04/15/2021 1210   BILIRUBINUR NEGATIVE 04/15/2021 1210   KETONESUR NEGATIVE 04/15/2021 1210   PROTEINUR NEGATIVE 04/15/2021 1210   UROBILINOGEN 0.2 08/18/2012 1330   NITRITE NEGATIVE 04/15/2021 1210   LEUKOCYTESUR NEGATIVE 04/15/2021 1210      Imaging Results:    CT Head Wo Contrast  Result Date: 04/30/2021 CLINICAL DATA:  Headache and hypertension. History of breast cancer. EXAM: CT HEAD WITHOUT CONTRAST TECHNIQUE: Contiguous axial images were obtained from the base of the skull through the vertex without intravenous contrast. COMPARISON:  CT head dated 04/15/2021. FINDINGS: Brain: No evidence of acute infarction, hemorrhage, hydrocephalus, extra-axial collection or mass lesion/mass effect. Periventricular white matter hypoattenuation likely represents chronic small vessel ischemic disease. There is mild cerebral volume loss with associated ex vacuo dilatation. Vascular: There are vascular calcifications in the carotid siphons. Skull: Normal. Negative for fracture or focal lesion. Sinuses/Orbits: No acute finding. Other: None. IMPRESSION: No acute intracranial process. Electronically Signed   By: Zerita Boers M.D.   On: 04/30/2021 19:04    EKG  pending   Assessment & Plan:    Active Problems:   Hypertension   Breast cancer ([place])   Multiple pulmonary emboli ([facility])   TIA (transient ischemic attack)   Hyperlipidemia   TIA Difficulty speaking, headache, dizziness lasting 30-40 minutes Hypertensive at presentation up to 202/97 Neurology recommends continuing permissive hypertension Patient will be admitted to [facility] for inpatient neurology assessment and TIA work-up Lipid panel, hemoglobin A1c in the a.m. Echo in the a.m. MRI brain No Plavix or aspirin at this time as patient is already on Xarelto Continue statin Continue to monitor on telemetry Hypokalemia Replace and recheck Hypertensive crisis TIA Allowing permissive hypertension at this time Holding home medications including ARB, clonidine, metoprolol, and amlodipine .  None low-dose hydralazine for blood pressure greater than 220/110 History of PE Continue Xarelto History of breast cancer Continue Xeloda Hyperlipidemia Continue statin   DVT Prophylaxis-Xarelto SCDs   AM Labs Ordered, also please review Full Orders  Family Communication: No family at bedside  Code Status: Full  Admission status: Observation Time spent in minutes : [place]

## 2021-05-01 NOTE — ED Notes (Signed)
Attempted to call report no answer, [facility] at bedside.

## 2021-05-02 DIAGNOSIS — I10 Essential (primary) hypertension: Secondary | ICD-10-CM

## 2021-05-02 LAB — HEMOGLOBIN A1C
Hgb A1c MFr Bld: 6.1 % — ABNORMAL HIGH (ref 4.8–5.6)
Mean Plasma Glucose: 128 mg/dL

## 2021-05-02 MED ORDER — LOSARTAN POTASSIUM 25 MG PO TABS
25.0000 mg | ORAL_TABLET | Freq: Every day | ORAL | Status: DC
  Administered 2021-05-02: 25 mg via ORAL
  Filled 2021-05-02 (×2): qty 1

## 2021-05-02 MED ORDER — METOPROLOL TARTRATE 25 MG PO TABS
25.0000 mg | ORAL_TABLET | Freq: Every day | ORAL | Status: DC
  Administered 2021-05-02: 25 mg via ORAL
  Filled 2021-05-02: qty 1

## 2021-05-02 MED ORDER — ATORVASTATIN CALCIUM 80 MG PO TABS: 80.0000 mg | ORAL_TABLET | Freq: Every day | ORAL | 2 refills | Status: DC

## 2021-05-02 MED ORDER — ATORVASTATIN CALCIUM 80 MG PO TABS
80.0000 mg | ORAL_TABLET | Freq: Every day | ORAL | Status: DC
  Administered 2021-05-02: 80 mg via ORAL
  Filled 2021-05-02: qty 1

## 2021-05-02 NOTE — Plan of Care (Signed)
  Problem: Education: Goal: Knowledge of disease or condition will improve Outcome: Adequate for Discharge Goal: Knowledge of secondary prevention will improve Outcome: Adequate for Discharge Goal: Knowledge of patient specific risk factors addressed and post discharge goals established will improve Outcome: Adequate for Discharge Goal: Individualized Educational Video(s) Outcome: Adequate for Discharge

## 2021-05-02 NOTE — Discharge Summary (Signed)
Physician Discharge Summary  Margaret Avila [id number] DOB: 1950/12/01 DOA: 04/30/2021  PCP: Antionette Fairy, PA-C  Admit date: 04/30/2021  Discharge date: 05/02/2021  Admitted From:Home  Disposition:  Home  Recommendations for Outpatient Follow-up:  Follow up with PCP in 1-2 weeks Continue now on atorvastatin 80 mg daily as opposed to 40 mg daily per neurology recommendations to improve LDL Continue on home blood pressure medications as previous and monitor closely with follow-up to PCP recommended in the next 1 week Patient states that she does not take clonidine or hydrochlorothiazide at home and blood pressures appear to have normalized without the use of these medications  Home Health: Yes with PT  Equipment/Devices: Home rolling walker  Discharge Condition:Stable  CODE STATUS: Full  Diet recommendation: Heart Healthy  Brief/Interim Summary: Per HPI: Margaret Avila  is a 70 y.o. female,GERD, breast cancer, HLD, HTN, history of PE, and more presents to the ED with a chief complaint of high blood pressure. Patient reports that at 1 pm on 7/23 she had sudden onset of difficulty speaking, headache, and dizziness.  Patient describes her headache as pressure, throbbing, located over the forehead.  She had no blurry vision.  She describes her dizziness as "feeling like she was swimming."  She does clarify that she did not have asymmetric weakness.  She does not really understand questions regarding proprioception.  Patient reports that she had no difficulty swallowing, and no weakness in her extremities. She reports that she has not had a CVA or any symptoms like this in the past. She reports that the symptoms lasted approx 30-40 mins. She is not sure what provoked or relieved the symptoms. Patient reports that she is back to her baseline at this time.  -Patient was admitted for neurology evaluation of suspected TIA.  She was seen by neurology and was thought to have symptoms related  to recurrent hypertensive emergency due to poor blood pressure control at home.  Her blood pressures had improved spontaneously without the use of her home medications.  Her blood pressures have been averaging 160/80 and she is otherwise asymptomatic and reports that she feels as though she is back to her baseline.  PT has assessed this patient with recommendations for home health PT with which she will be discharged today as well as rolling walker.  Neurology had recommended increasing atorvastatin dose which has now been adjusted to 80 mg.  She will continue her usual home medications as previous.  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  Discharge Instructions  Discharge Instructions     Diet - low sodium heart healthy   Complete by: As directed    Increase activity slowly   Complete by: As directed       Allergies as of 05/02/2021   No Known Allergies      Medication List     STOP taking these medications    cloNIDine 0.2 MG tablet Commonly known as: CATAPRES   gabapentin 300 MG capsule Commonly known as: NEURONTIN   hydrochlorothiazide 25 MG tablet Commonly known as: HYDRODIURIL   olmesartan 40 MG tablet Commonly known as: BENICAR   oxybutynin 5 MG tablet Commonly known as: DITROPAN       TAKE these medications    amLODipine 10 MG tablet Commonly known as: NORVASC Take 10 mg  by mouth daily. What changed: Another medication with the same name was removed. Continue taking this medication, and follow the directions you see here.   atorvastatin 80 MG tablet Commonly known as: LIPITOR Take 1 tablet (80 mg total) by mouth daily. What changed:  medication strength how much to take Another medication with the same name was removed. Continue taking this  medication, and follow the directions you see here.   capecitabine 500 MG tablet Commonly known as: XELODA Take 1,000 mg/m2 by mouth 2 (two) times daily after a meal.   losartan 25 MG tablet Commonly known as: COZAAR Take 25 mg by mouth daily.   metoprolol tartrate 25 MG tablet Commonly known as: LOPRESSOR Take 25 mg by mouth daily.   rivaroxaban 20 MG Tabs tablet Commonly known as: Xarelto Start after 11/23/14. '20mg'$  po daily What changed:  how much to take when to take this additional instructions               Durable Medical Equipment  (From admission, onward)           Start     Ordered   05/02/21 0928  DME Gilford Rile  Once       Question Answer Comment  Walker: With [place] Wheels   Patient needs a walker to treat with the following condition Weakness      05/02/21 0927            Follow-up Information     Antionette Fairy, PA-C. Schedule an appointment as soon as possible for a visit in 1 week(s).   Specialty: Physician Assistant Contact information: [address] [place] [address] [phone]                No Known Allergies  Consultations: Neurology   Procedures/Studies: DG Chest 1 View  Result Date: 04/15/2021 CLINICAL DATA:  Dizziness and high blood pressure. EXAM: CHEST  1 VIEW COMPARISON:  03/18/2017 FINDINGS: 1141 hours. The lungs are clear without focal pneumonia, edema, pneumothorax or pleural effusion. The cardiopericardial silhouette is within normal limits for size. Right Port-A-Cath again noted. The visualized bony structures of the thorax show no acute abnormality. Telemetry leads overlie the chest. IMPRESSION: No active disease. Electronically Signed   By: Misty Stanley M.D.   On: 04/15/2021 12:29   CT Head Wo Contrast  Result Date: 04/30/2021 CLINICAL DATA:  Headache and hypertension. History of breast cancer. EXAM: CT HEAD WITHOUT CONTRAST TECHNIQUE: Contiguous axial images were obtained from the base of the  skull through the vertex without intravenous contrast. COMPARISON:  CT head dated 04/15/2021. FINDINGS: Brain: No evidence of acute infarction, hemorrhage, hydrocephalus, extra-axial collection or mass lesion/mass effect. Periventricular white matter hypoattenuation likely represents chronic small vessel ischemic disease. There is mild cerebral volume loss with associated ex vacuo dilatation. Vascular: There are vascular calcifications in the carotid siphons. Skull: Normal. Negative for fracture or focal lesion. Sinuses/Orbits: No acute finding. Other: None. IMPRESSION: No acute intracranial process. Electronically Signed   By: Zerita Boers M.D.   On: 04/30/2021 19:04   CT Head Wo Contrast  Result Date: 04/15/2021 CLINICAL DATA:  Dizziness, hypertension EXAM: CT HEAD WITHOUT CONTRAST TECHNIQUE: Contiguous axial images were obtained from the base of the skull through the vertex without intravenous contrast. COMPARISON:  03/18/2017 FINDINGS: Brain: No evidence of acute infarction, hemorrhage, hydrocephalus, extra-axial collection or mass lesion/mass effect. Mild-moderate low-density changes within the periventricular and subcortical white matter compatible with chronic microvascular ischemic change. Mild  diffuse cerebral volume loss. Vascular: Atherosclerotic calcifications involving the large vessels of the skull base. No unexpected hyperdense vessel. Skull: Normal. Negative for fracture or focal lesion. Sinuses/Orbits: No acute finding. Other: None. IMPRESSION: 1. No acute intracranial findings. 2. Chronic microvascular ischemic change and cerebral volume loss. Electronically Signed   By: Davina Poke D.O.   On: 04/15/2021 12:36   MR ANGIO HEAD WO CONTRAST  Result Date: 05/01/2021 CLINICAL DATA:  Neuro deficit with acute stroke suspected. Sudden onset difficulty speaking with headache and dizziness EXAM: MRI HEAD WITHOUT CONTRAST MRA HEAD WITHOUT CONTRAST TECHNIQUE: Multiplanar, multi-echo pulse  sequences of the brain and surrounding structures were acquired without intravenous contrast. Angiographic images of the Circle of Willis were acquired using MRA technique without intravenous contrast. COMPARISON:  Head CT from yesterday FINDINGS: MRI HEAD FINDINGS Brain: No acute infarction, hemorrhage, hydrocephalus, extra-axial collection or mass lesion. Chronic small vessel ischemic change in the hemispheric white matter and pons, fairly extensive and confluent in the cerebral white matter. Normal brain volume Vascular: See below Skull and upper cervical spine: Normal marrow signal. Sinuses/Orbits: No acute or significant finding. MRA HEAD FINDINGS Anterior circulation: Suspect mild atheromatous undulation of medium size vessels. Negative for aneurysm or beading. Posterior circulation: Suspect mild atheromatous narrowing of the vertebral and right P2 segments. No aneurysm or beading. IMPRESSION: Brain MRI: 1. No acute infarct. 2. Chronic small vessel ischemia in the cerebral white matter and pons. Intracranial MRA: 1. No emergent finding. 2. Mild intracranial atherosclerosis. Electronically Signed   By: Monte Fantasia M.D.   On: 05/01/2021 10:54   MR BRAIN WO CONTRAST  Result Date: 05/01/2021 CLINICAL DATA:  Neuro deficit with acute stroke suspected. Sudden onset difficulty speaking with headache and dizziness EXAM: MRI HEAD WITHOUT CONTRAST MRA HEAD WITHOUT CONTRAST TECHNIQUE: Multiplanar, multi-echo pulse sequences of the brain and surrounding structures were acquired without intravenous contrast. Angiographic images of the Circle of Willis were acquired using MRA technique without intravenous contrast. COMPARISON:  Head CT from yesterday FINDINGS: MRI HEAD FINDINGS Brain: No acute infarction, hemorrhage, hydrocephalus, extra-axial collection or mass lesion. Chronic small vessel ischemic change in the hemispheric white matter and pons, fairly extensive and confluent in the cerebral white matter. Normal  brain volume Vascular: See below Skull and upper cervical spine: Normal marrow signal. Sinuses/Orbits: No acute or significant finding. MRA HEAD FINDINGS Anterior circulation: Suspect mild atheromatous undulation of medium size vessels. Negative for aneurysm or beading. Posterior circulation: Suspect mild atheromatous narrowing of the vertebral and right P2 segments. No aneurysm or beading. IMPRESSION: Brain MRI: 1. No acute infarct. 2. Chronic small vessel ischemia in the cerebral white matter and pons. Intracranial MRA: 1. No emergent finding. 2. Mild intracranial atherosclerosis. Electronically Signed   By: Monte Fantasia M.D.   On: 05/01/2021 10:54   ECHOCARDIOGRAM COMPLETE  Result Date: 05/01/2021    ECHOCARDIOGRAM REPORT   Patient Name:   Margaret Avila Date of Exam: 05/01/2021 Medical Rec #:  [id number]        Height:       63.0 in Accession #:    [id number]       Weight:       170.0 lb Date of Birth:  12-22-1950         BSA:          1.805 m Patient Age:    70 years         BP:           175/106 mmHg  Patient Gender: F                HR:           93 bpm. Exam Location:  Inpatient Procedure: 2D Echo, Cardiac Doppler and Color Doppler Indications:    TIA  History:        Patient has prior history of Echocardiogram examinations, most                 recent 07/22/2008. TIA; Risk Factors:Hypertension.  Sonographer:    Wenda Low Referring Phys: [id number] ASIA B [place]  1. Left ventricular ejection fraction, by estimation, is 65 to 70%. The left ventricle has normal function. The left ventricle has no regional wall motion abnormalities. There is moderate left ventricular hypertrophy. Left ventricular diastolic parameters are consistent with Grade I diastolic dysfunction (impaired relaxation).  2. Right ventricular systolic function is normal. The right ventricular size is normal.  3. The mitral valve is normal in structure. No evidence of mitral valve regurgitation. No evidence of mitral  stenosis.  4. The aortic valve is tricuspid. Aortic valve regurgitation is mild. No aortic stenosis is present. FINDINGS  Left Ventricle: Left ventricular ejection fraction, by estimation, is 65 to 70%. The left ventricle has normal function. The left ventricle has no regional wall motion abnormalities. The left ventricular internal cavity size was normal in size. There is  moderate left ventricular hypertrophy. Left ventricular diastolic parameters are consistent with Grade I diastolic dysfunction (impaired relaxation). Normal left ventricular filling pressure. Right Ventricle: The right ventricular size is normal. No increase in right ventricular wall thickness. Right ventricular systolic function is normal. Left Atrium: Left atrial size was normal in size. Right Atrium: Right atrial size was normal in size. Pericardium: There is no evidence of pericardial effusion. Mitral Valve: The mitral valve is normal in structure. No evidence of mitral valve regurgitation. No evidence of mitral valve stenosis. MV peak gradient, 4.5 mmHg. The mean mitral valve gradient is 2.0 mmHg. Tricuspid Valve: The tricuspid valve is normal in structure. Tricuspid valve regurgitation is not demonstrated. No evidence of tricuspid stenosis. Aortic Valve: The aortic valve is tricuspid. Aortic valve regurgitation is mild. No aortic stenosis is present. Aortic valve mean gradient measures 5.0 mmHg. Aortic valve peak gradient measures 8.9 mmHg. Aortic valve area, by VTI measures 2.76 cm. Pulmonic Valve: The pulmonic valve was normal in structure. Pulmonic valve regurgitation is not visualized. No evidence of pulmonic stenosis. Aorta: The aortic root is normal in size and structure. IAS/Shunts: The interatrial septum was not well visualized.  LEFT VENTRICLE PLAX 2D LVIDd:         4.00 cm  Diastology LVIDs:         1.90 cm  LV e' medial:    3.48 cm/s LV PW:         1.30 cm  LV E/e' medial:  18.1 LV IVS:        1.30 cm  LV e' lateral:   8.70 cm/s  LVOT diam:     2.00 cm  LV E/e' lateral: 7.2 LV SV:         76 LV SV Index:   42 LVOT Area:     3.14 cm  RIGHT VENTRICLE RV Basal diam:  2.40 cm RV Mid diam:    2.70 cm RV S prime:     17.30 cm/s TAPSE (M-mode): 2.0 cm LEFT ATRIUM             Index  RIGHT ATRIUM           Index LA diam:        3.60 cm 1.99 cm/m  RA Area:     10.70 cm LA Vol (A2C):   41.2 ml 22.83 ml/m RA Volume:   17.00 ml  9.42 ml/m LA Vol (A4C):   21.1 ml 11.69 ml/m LA Biplane Vol: 30.4 ml 16.85 ml/m  AORTIC VALVE AV Area (Vmax):    2.72 cm AV Area (Vmean):   2.78 cm AV Area (VTI):     2.76 cm AV Vmax:           149.00 cm/s AV Vmean:          105.000 cm/s AV VTI:            0.274 m AV Peak Grad:      8.9 mmHg AV Mean Grad:      5.0 mmHg LVOT Vmax:         129.00 cm/s LVOT Vmean:        92.800 cm/s LVOT VTI:          0.241 m LVOT/AV VTI ratio: 0.88  AORTA Ao Root diam: 2.50 cm Ao Asc diam:  3.40 cm MITRAL VALVE MV Area (PHT): 2.91 cm    SHUNTS MV Area VTI:   3.36 cm    Systemic VTI:  0.24 m MV Peak grad:  4.5 mmHg    Systemic Diam: 2.00 cm MV Mean grad:  2.0 mmHg MV Vmax:       1.06 m/s MV Vmean:      57.7 cm/s MV Decel Time: 261 msec MV E velocity: 63.00 cm/s MV A velocity: 94.00 cm/s MV E/A ratio:  0.67 Carlyle Dolly MD Electronically signed by Carlyle Dolly MD Signature Date/Time: 05/01/2021/1:57:57 PM    Final      Discharge Exam: Vitals:   05/02/21 0410 05/02/21 0727  BP: (!) 174/101 (!) 160/82  Pulse: 86 83  Resp: 17 18  Temp: 97.7 F (36.5 C) 98.3 F (36.8 C)  SpO2: 100% 99%   Vitals:   05/01/21 2040 05/01/21 2331 05/02/21 0410 05/02/21 0727  BP: (!) 171/90 (!) 163/89 (!) 174/101 (!) 160/82  Pulse: 74 87 86 83  Resp: '18 17 17 18  '$ Temp: 98.1 F (36.7 C) 98.3 F (36.8 C) 97.7 F (36.5 C) 98.3 F (36.8 C)  TempSrc: Oral Oral Oral Oral  SpO2: 100% 98% 100% 99%  Weight:      Height:        General: Pt is alert, awake, not in acute distress Cardiovascular: RRR, S1/S2 +, no rubs, no  gallops Respiratory: CTA bilaterally, no wheezing, no rhonchi Abdominal: Soft, NT, ND, bowel sounds + Extremities: no edema, no cyanosis    The results of significant diagnostics from this hospitalization (including imaging, microbiology, ancillary and laboratory) are listed below for reference.     Microbiology: Recent Results (from the past 240 hour(s))  Resp Panel by RT-PCR (Flu A&B, Covid) Nasopharyngeal Swab     Status: None   Collection Time: 04/30/21  9:04 PM   Specimen: Nasopharyngeal Swab; Nasopharyngeal(NP) swabs in vial transport medium  Result Value Ref Range Status   SARS Coronavirus 2 by RT PCR NEGATIVE NEGATIVE Final    Comment: (NOTE) SARS-CoV-2 target nucleic acids are NOT DETECTED.  The SARS-CoV-2 RNA is generally detectable in upper respiratory specimens during the acute phase of infection. The lowest concentration of SARS-CoV-2 viral copies this assay can detect is 138 copies/mL.  A negative result does not preclude SARS-Cov-2 infection and should not be used as the sole basis for treatment or other patient management decisions. A negative result may occur with  improper specimen collection/handling, submission of specimen other than nasopharyngeal swab, presence of viral mutation(s) within the areas targeted by this assay, and inadequate number of viral copies(<138 copies/mL). A negative result must be combined with clinical observations, patient history, and epidemiological information. The expected result is Negative.  Fact Sheet for Patients:  EntrepreneurPulse.com.au  Fact Sheet for Healthcare Providers:  IncredibleEmployment.be  This test is no t yet approved or cleared by the [place] FDA and  has been authorized for detection and/or diagnosis of SARS-CoV-2 by FDA under an Emergency Use Authorization (EUA). This EUA will remain  in effect (meaning this test can be used) for the duration of the COVID-19  declaration under Section 564(b)(1) of the Act, 21 U.S.C.section 360bbb-3(b)(1), unless the authorization is terminated  or revoked sooner.       Influenza A by PCR NEGATIVE NEGATIVE Final   Influenza B by PCR NEGATIVE NEGATIVE Final    Comment: (NOTE) The Xpert Xpress SARS-CoV-2/FLU/RSV plus assay is intended as an aid in the diagnosis of influenza from Nasopharyngeal swab specimens and should not be used as a sole basis for treatment. Nasal washings and aspirates are unacceptable for Xpert Xpress SARS-CoV-2/FLU/RSV testing.  Fact Sheet for Patients: EntrepreneurPulse.com.au  Fact Sheet for Healthcare Providers: IncredibleEmployment.be  This test is not yet approved or cleared by the [place] FDA and has been authorized for detection and/or diagnosis of SARS-CoV-2 by FDA under an Emergency Use Authorization (EUA). This EUA will remain in effect (meaning this test can be used) for the duration of the COVID-19 declaration under Section 564(b)(1) of the Act, 21 U.S.C. section 360bbb-3(b)(1), unless the authorization is terminated or revoked.  Performed at [facility], [address] [place], [place] [address]      Labs: BNP (last 3 results) No results for input(s): BNP in the last 8760 hours. Basic Metabolic Panel: Recent Labs  Lab 04/30/21 1849 05/01/21 0411  NA 135 136  K 3.2* 3.8  CL 104 108  CO2 24 23  GLUCOSE 107* 107*  BUN 15 11  CREATININE 0.76 0.62  CALCIUM 8.7* 8.8*   Liver Function Tests: Recent Labs  Lab 05/01/21 0411  AST 16  ALT 10  ALKPHOS 118  BILITOT 0.9  PROT 7.4  ALBUMIN 3.7   No results for input(s): LIPASE, AMYLASE in the last 168 hours. No results for input(s): AMMONIA in the last 168 hours. CBC: Recent Labs  Lab 04/30/21 1849 05/01/21 0411  WBC 9.1 6.9  NEUTROABS 6.9  --   HGB 11.5* 12.4  HCT 35.8* 39.3  MCV 86.9 87.7  PLT 243 239   Cardiac Enzymes: No results for input(s):  CKTOTAL, CKMB, CKMBINDEX, TROPONINI in the last 168 hours. BNP: Invalid input(s): POCBNP CBG: No results for input(s): GLUCAP in the last 168 hours. D-Dimer No results for input(s): DDIMER in the last 72 hours. Hgb A1c No results for input(s): HGBA1C in the last 72 hours. Lipid Profile Recent Labs    05/01/21 0411  CHOL 263*  HDL 74  LDLCALC 176*  TRIG 67  CHOLHDL 3.6   Thyroid function studies No results for input(s): TSH, T4TOTAL, T3FREE, THYROIDAB in the last 72 hours.  Invalid input(s): FREET3 Anemia work up No results for input(s): VITAMINB12, FOLATE, FERRITIN, TIBC, IRON, RETICCTPCT in the last 72 hours. Urinalysis  Component Value Date/Time   COLORURINE YELLOW 04/30/2021 2337   APPEARANCEUR CLEAR 04/30/2021 2337   LABSPEC 1.020 04/30/2021 2337   PHURINE 6.0 04/30/2021 2337   GLUCOSEU NEGATIVE 04/30/2021 2337   HGBUR TRACE (A) 04/30/2021 2337   BILIRUBINUR NEGATIVE 04/30/2021 2337   KETONESUR NEGATIVE 04/30/2021 2337   PROTEINUR NEGATIVE 04/30/2021 2337   UROBILINOGEN 0.2 08/18/2012 1330   NITRITE NEGATIVE 04/30/2021 2337   LEUKOCYTESUR NEGATIVE 04/30/2021 2337   Sepsis Labs Invalid input(s): PROCALCITONIN,  WBC,  LACTICIDVEN Microbiology Recent Results (from the past 240 hour(s))  Resp Panel by RT-PCR (Flu A&B, Covid) Nasopharyngeal Swab     Status: None   Collection Time: 04/30/21  9:04 PM   Specimen: Nasopharyngeal Swab; Nasopharyngeal(NP) swabs in vial transport medium  Result Value Ref Range Status   SARS Coronavirus 2 by RT PCR NEGATIVE NEGATIVE Final    Comment: (NOTE) SARS-CoV-2 target nucleic acids are NOT DETECTED.  The SARS-CoV-2 RNA is generally detectable in upper respiratory specimens during the acute phase of infection. The lowest concentration of SARS-CoV-2 viral copies this assay can detect is 138 copies/mL. A negative result does not preclude SARS-Cov-2 infection and should not be used as the sole basis for treatment or other patient  management decisions. A negative result may occur with  improper specimen collection/handling, submission of specimen other than nasopharyngeal swab, presence of viral mutation(s) within the areas targeted by this assay, and inadequate number of viral copies(<138 copies/mL). A negative result must be combined with clinical observations, patient history, and epidemiological information. The expected result is Negative.  Fact Sheet for Patients:  EntrepreneurPulse.com.au  Fact Sheet for Healthcare Providers:  IncredibleEmployment.be  This test is no t yet approved or cleared by the [place] FDA and  has been authorized for detection and/or diagnosis of SARS-CoV-2 by FDA under an Emergency Use Authorization (EUA). This EUA will remain  in effect (meaning this test can be used) for the duration of the COVID-19 declaration under Section 564(b)(1) of the Act, 21 U.S.C.section 360bbb-3(b)(1), unless the authorization is terminated  or revoked sooner.       Influenza A by PCR NEGATIVE NEGATIVE Final   Influenza B by PCR NEGATIVE NEGATIVE Final    Comment: (NOTE) The Xpert Xpress SARS-CoV-2/FLU/RSV plus assay is intended as an aid in the diagnosis of influenza from Nasopharyngeal swab specimens and should not be used as a sole basis for treatment. Nasal washings and aspirates are unacceptable for Xpert Xpress SARS-CoV-2/FLU/RSV testing.  Fact Sheet for Patients: EntrepreneurPulse.com.au  Fact Sheet for Healthcare Providers: IncredibleEmployment.be  This test is not yet approved or cleared by the [place] FDA and has been authorized for detection and/or diagnosis of SARS-CoV-2 by FDA under an Emergency Use Authorization (EUA). This EUA will remain in effect (meaning this test can be used) for the duration of the COVID-19 declaration under Section 564(b)(1) of the Act, 21 U.S.C. section 360bbb-3(b)(1),  unless the authorization is terminated or revoked.  Performed at [facility], [address] [place], [place] [address]      Time coordinating discharge: 35 minutes  SIGNED:   Rodena Goldmann, DO [facility] 05/02/2021, 9:28 AM  If 7PM-7AM, please contact night-coverage [email or web address]

## 2021-05-02 NOTE — Evaluation (Signed)
Occupational Therapy Evaluation/Discharge Patient Details Name: Margaret Avila MRN: [id number] DOB: February 02, 1951 Today's Date: 05/02/2021    History of Present Illness 70 y.o. female presented to the ED with a chief complaint of high blood pressure, difficulty speaking, headache, and dizziness. CT head and MRI brain show no acute intracranial process  PMH--GERD, breast cancer, HLD, HTN, history of PE   Clinical Impression   PTA, pt lives alone and reports Independence with ADLs, IADLs in the home and mobility though endorses frequent falls. Pt able to mobilize with distant supervision using RW and reports feeling more steady with this DME. After initial Setup of linens, pt able to complete all bathing, dressing and toileting ADLs with no physical assistance. Provided fall prevention education with handout to maximize safety at home. Pt reports feeling much better than admission with strength and coordination WFL. No further skilled OT services needed, OT to sign off.     Follow Up Recommendations  No OT follow up    Equipment Recommendations  Other (comment) (Rolling walker)    Recommendations for Other Services       Precautions / Restrictions Precautions Precautions: Fall Precaution Comments: pt reports she fell 7 times last week Restrictions Weight Bearing Restrictions: No      Mobility Bed Mobility Overal bed mobility: Independent                  Transfers Overall transfer level: Needs assistance Equipment used: Rolling walker (2 wheeled) Transfers: Sit to/from Stand Sit to Stand: Supervision         General transfer comment: supervision with cues needed for hand placement    Balance Overall balance assessment: Needs assistance;History of Falls Sitting-balance support: No upper extremity supported;Feet supported Sitting balance-Leahy Scale: Good     Standing balance support: No upper extremity supported Standing balance-Leahy Scale: Fair Standing  balance comment: fair static standing, reaches out for UE support during dynamic tasks                           ADL either performed or assessed with clinical judgement   ADL Overall ADL's : Needs assistance/impaired Eating/Feeding: Independent;Sitting   Grooming: Modified independent;Standing   Upper Body Bathing: Independent;Sitting   Lower Body Bathing: Set up;Sit to/from stand   Upper Body Dressing : Independent;Sitting   Lower Body Dressing: Set up;Sit to/from stand   Toilet Transfer: Supervision/safety;Ambulation;Regular Toilet;RW   Toileting- Clothing Manipulation and Hygiene: Modified independent;Sit to/from stand       Functional mobility during ADLs: Supervision/safety;Rolling walker General ADL Comments: Pt requires no physical assist for ADLs, able to complete all tasks with Setup only. Provided education on fall prevention stratgeies with handout provided. Pt reports feeling more steady with RW use though able to perform ADLs without AD. Able to bend to pick up linens to transport out of bathroom without LOB     Vision Baseline Vision/History: Wears glasses Wears Glasses: At all times Patient Visual Report: No change from baseline Vision Assessment?: No apparent visual deficits     Perception     Praxis      Pertinent Vitals/Pain Pain Assessment: No/denies pain     Hand Dominance Right   Extremity/Trunk Assessment Upper Extremity Assessment Upper Extremity Assessment: Overall WFL for tasks assessed (4+/5)   Lower Extremity Assessment Lower Extremity Assessment: Defer to PT evaluation   Cervical / Trunk Assessment Cervical / Trunk Assessment: Normal   Communication Communication Communication: No difficulties   Cognition  Arousal/Alertness: Awake/alert Behavior During Therapy: WFL for tasks assessed/performed Overall Cognitive Status: Within Functional Limits for tasks assessed                                      General Comments  BP 160/82 at start of session    Exercises     Shoulder Instructions      Home Living Family/patient expects to be discharged to:: Private residence Living Arrangements: Alone Available Help at Discharge: Family;Friend(s);Available PRN/intermittently Type of Home: Apartment Home Access: Stairs to enter     Home Layout: Two level Alternate Level Stairs-Number of Steps: flight   Bathroom Shower/Tub: Teacher, early years/pre: Handicapped height     Home Equipment: Environmental consultant - standard;Cane - single point          Prior Functioning/Environment Level of Independence: Independent        Comments: but with multiple falls per week; states often falls backwards, but always just loses her balance and then falls. Pt reports assist with transportation, able to do all ADLs, IADLs        OT Problem List:        OT Treatment/Interventions:      OT Goals(Current goals can be found in the care plan section) Acute Rehab OT Goals Patient Stated Goal: obtain a walker to help her stop falling OT Goal Formulation: All assessment and education complete, DC therapy  OT Frequency:     Barriers to D/C:            Co-evaluation              AM-PAC OT "6 Clicks" Daily Activity     Outcome Measure Help from another person eating meals?: None Help from another person taking care of personal grooming?: None Help from another person toileting, which includes using toliet, bedpan, or urinal?: None Help from another person bathing (including washing, rinsing, drying)?: None Help from another person to put on and taking off regular upper body clothing?: None Help from another person to put on and taking off regular lower body clothing?: None 6 Click Score: 24   End of Session Equipment Utilized During Treatment: Rolling walker  Activity Tolerance: Patient tolerated treatment well Patient left: in bed;with call bell/phone within reach  OT Visit  Diagnosis: Unsteadiness on feet (R26.81);Other abnormalities of gait and mobility (R26.89)                Time: [id number] OT Time Calculation (min): 27 min Charges:  OT General Charges $OT Visit: 1 Visit OT Evaluation $OT Eval Low Complexity: 1 Low OT Treatments $Self Care/Home Management : 8-22 mins  Malachy Chamber, OTR/L Acute Rehab Services Office: [phone]   Layla Maw 05/02/2021, 8:35 AM

## 2021-05-02 NOTE — Progress Notes (Signed)
Discharge instructions (including medications) discussed with and copy provided to patient/caregiver 

## 2021-05-02 NOTE — TOC Transition Note (Signed)
Transition of Care (TOC) - CM/SW Discharge Note Marvetta Gibbons RN, BSN Transitions of Care Unit 4E- RN Case Manager See Treatment Team for direct phone #    Patient Details  Name: Margaret Avila MRN: [id number] Date of Birth: 1950/11/22  Transition of Care [facility]) CM/SW Contact:  Dawayne Patricia, RN Phone Number: 05/02/2021, 11:43 AM   Clinical Narrative:    Pt stable for transition home today, orders have been placed for [facility] and DME needs. Cm in to speak with pt at bedside. Discussed with pt HH and DME needs. List provided for [facility] choice Per CMS guidelines from medicare.gov website with star ratings (copy placed in shadow chart) - pt has selected [facility] for [place] needs and is agreeable to in house provider for DME needs.   Address, phone #s and PCP all confirmed in epic with pt.  Pt reports that her grandson will be coming to transport her home.   Call made to [employer] for DME needs- RW to be delivered to room prior to discharge. ([employer] to check on coverage and call pt should she have to pay anything)  Call made to [facility] with [facility] for HHPT referral- referral has been confirmed accepted.    Final next level of care: [place] Barriers to Discharge: No Barriers Identified   Patient Goals and CMS Choice Patient states their goals for this hospitalization and ongoing recovery are:: return home CMS Medicare.gov Compare Post Acute Care list provided to:: Patient Choice offered to / list presented to : Patient  Discharge Placement               Home with [facility]        Discharge Plan and Services   Discharge Planning Services: CM Consult Post Acute Care Choice: Durable Medical Equipment, Home Health          DME Arranged: Walker rolling DME Agency: [employer] Date DME Agency Contacted: 05/02/21 Time DME Agency Contacted: 62 Representative spoke with at DME Agency: [place]: PT [place]: [place] ([place]) Date [place]:  05/02/21 Time [place]: 1030 Representative spoke with at [place]: [place] ([place]) Interventions     Readmission Risk Interventions No flowsheet data found.

## 2021-05-17 ENCOUNTER — Telehealth: Payer: Self-pay

## 2021-05-17 NOTE — Telephone Encounter (Signed)
Attempted to contact patient's sister Flossie  to schedule a Palliative Care consult appointment. No answer and voicemail is full.

## 2021-06-14 ENCOUNTER — Telehealth: Payer: Self-pay

## 2021-06-14 NOTE — Telephone Encounter (Signed)
Attempted to contact patient's sister Flossie to schedule a Palliative Care consult appointment. No answer and voicemail is full. [place] number is invalid.

## 2021-10-26 ENCOUNTER — Other Ambulatory Visit (HOSPITAL_COMMUNITY): Payer: Self-pay | Admitting: Family Medicine

## 2021-10-26 DIAGNOSIS — R011 Cardiac murmur, unspecified: Secondary | ICD-10-CM

## 2021-11-24 ENCOUNTER — Ambulatory Visit (HOSPITAL_COMMUNITY): Payer: Medicare Other

## 2021-12-15 ENCOUNTER — Ambulatory Visit (HOSPITAL_COMMUNITY): Payer: Medicare Other

## 2022-02-08 ENCOUNTER — Emergency Department (HOSPITAL_COMMUNITY): Payer: Medicare Other

## 2022-02-08 ENCOUNTER — Encounter (HOSPITAL_COMMUNITY): Payer: Self-pay | Admitting: Emergency Medicine

## 2022-02-08 ENCOUNTER — Other Ambulatory Visit: Payer: Self-pay

## 2022-02-08 ENCOUNTER — Emergency Department (HOSPITAL_COMMUNITY)
Admission: EM | Admit: 2022-02-08 | Discharge: 2022-02-08 | Disposition: A | Discharge status: Home / Self Care | Payer: Medicare Other | Attending: Emergency Medicine | Admitting: Emergency Medicine

## 2022-02-08 DIAGNOSIS — Z79899 Other long term (current) drug therapy: Secondary | ICD-10-CM | POA: Diagnosis not present

## 2022-02-08 DIAGNOSIS — I10 Essential (primary) hypertension: Secondary | ICD-10-CM | POA: Insufficient documentation

## 2022-02-08 DIAGNOSIS — E876 Hypokalemia: Secondary | ICD-10-CM | POA: Diagnosis not present

## 2022-02-08 DIAGNOSIS — R002 Palpitations: Secondary | ICD-10-CM | POA: Diagnosis present

## 2022-02-08 LAB — CBC WITH DIFFERENTIAL/PLATELET
Abs Immature Granulocytes: 0.02 10*3/uL (ref 0.00–0.07)
Basophils Absolute: 0 10*3/uL (ref 0.0–0.1)
Basophils Relative: 1 %
Eosinophils Absolute: 0 10*3/uL (ref 0.0–0.5)
Eosinophils Relative: 0 %
HCT: 40.8 % (ref 36.0–46.0)
Hemoglobin: 12.5 g/dL (ref 12.0–15.0)
Immature Granulocytes: 0 %
Lymphocytes Relative: 16 %
Lymphs Abs: 1.2 10*3/uL (ref 0.7–4.0)
MCH: 27.8 pg (ref 26.0–34.0)
MCHC: 30.6 g/dL (ref 30.0–36.0)
MCV: 90.9 fL (ref 80.0–100.0)
Monocytes Absolute: 0.7 10*3/uL (ref 0.1–1.0)
Monocytes Relative: 9 %
Neutro Abs: 5.7 10*3/uL (ref 1.7–7.7)
Neutrophils Relative %: 74 %
Platelets: 231 10*3/uL (ref 150–400)
RBC: 4.49 MIL/uL (ref 3.87–5.11)
RDW: 17.4 % — ABNORMAL HIGH (ref 11.5–15.5)
WBC: 7.7 10*3/uL (ref 4.0–10.5)
nRBC: 0 % (ref 0.0–0.2)

## 2022-02-08 LAB — TROPONIN I (HIGH SENSITIVITY)
Troponin I (High Sensitivity): 12 ng/L (ref <18)
Troponin I (High Sensitivity): 12 ng/L (ref <18)

## 2022-02-08 LAB — BASIC METABOLIC PANEL
Anion gap: 9 (ref 5–15)
BUN: 17 mg/dL (ref 8–23)
CO2: 22 mmol/L (ref 22–32)
Calcium: 9.5 mg/dL (ref 8.9–10.3)
Chloride: 106 mmol/L (ref 98–111)
Creatinine, Ser: 0.87 mg/dL (ref 0.44–1.00)
GFR, Estimated: >60 mL/min (ref >60)
Glucose, Bld: 99 mg/dL (ref 70–99)
Potassium: 3 mmol/L — ABNORMAL LOW (ref 3.5–5.1)
Sodium: 137 mmol/L (ref 135–145)

## 2022-02-08 MED ORDER — POTASSIUM CHLORIDE CRYS ER 20 MEQ PO TBCR
40.0000 meq | EXTENDED_RELEASE_TABLET | Freq: Once | ORAL | Status: AC
  Administered 2022-02-08: 40 meq via ORAL
  Filled 2022-02-08: qty 2

## 2022-02-08 MED ORDER — SODIUM CHLORIDE 0.9 % IV BOLUS
1000.0000 mL | Freq: Once | INTRAVENOUS | Status: AC
  Administered 2022-02-08: 1000 mL via INTRAVENOUS

## 2022-02-08 MED ORDER — POTASSIUM CHLORIDE CRYS ER 20 MEQ PO TBCR: 20.0000 meq | EXTENDED_RELEASE_TABLET | Freq: Every day | ORAL | 0 refills | Status: DC
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
## 2022-02-08 NOTE — Discharge Instructions (Addendum)
If you develop recurrent, continued, or worsening chest pain, shortness of breath, fever, vomiting, abdominal or back pain, or any other new/concerning symptoms then return to the ER for evaluation.  

## 2022-02-08 NOTE — ED Triage Notes (Signed)
Pt brought in by [facility] for hypertension and anxiety, pt has stressor at home causing anxiety, took bp meds this am, currently calm in triage. ?

## 2022-03-02 ENCOUNTER — Other Ambulatory Visit: Payer: Self-pay | Admitting: Orthopedic Surgery

## 2022-03-02 DIAGNOSIS — M25552 Pain in left hip: Secondary | ICD-10-CM

## 2022-03-21 ENCOUNTER — Encounter
Admission: RE | Admit: 2022-03-21 | Discharge: 2022-03-21 | Disposition: A | Discharge status: Home / Self Care | Payer: Medicare Other | Source: Ambulatory Visit | Attending: Orthopedic Surgery | Admitting: Orthopedic Surgery

## 2022-03-21 DIAGNOSIS — M25552 Pain in left hip: Secondary | ICD-10-CM | POA: Insufficient documentation

## 2022-03-21 MED ORDER — TECHNETIUM TC 99M MEDRONATE IV KIT
20.0000 | PACK | Freq: Once | INTRAVENOUS | Status: AC | PRN
Start: 2022-03-21 — End: 2022-03-21
  Administered 2022-03-21: 22.23 via INTRAVENOUS

## 2022-05-05 ENCOUNTER — Other Ambulatory Visit: Payer: Self-pay | Admitting: Orthopedic Surgery

## 2022-05-05 ENCOUNTER — Encounter: Payer: Self-pay | Admitting: Orthopedic Surgery

## 2022-05-05 DIAGNOSIS — Z01818 Encounter for other preprocedural examination: Secondary | ICD-10-CM

## 2022-05-05 NOTE — H&P (Signed)
NAME: Margaret Avila MRN:   [id number] DOB:   [birth year]     HISTORY AND PHYSICAL  CHIEF COMPLAINT:  left hip pain  HISTORY:   Margaret Avila a 71 y.o. female  with left  Hip Pain Patient complains of left hip pain. Onset of the symptoms was several years ago. Inciting event: none. The patient reports the hip pain is worse with weight bearing, is aggravated by walking, and is worse after period of inactivity. Associated symptoms: none. Aggravating symptoms include: any weight bearing, going up and down stairs, and inactivity. Patient has had prior hip problems. Previous visits for this problem: yes, last seen several weeks ago by Dr. Harlow Mares . Evaluation to date: plain films, which were abnormal  loosening . Treatment to date: OTC analgesics, which have been somewhat effective, prescription analgesics, which have been somewhat effective, and home exercise program, which has been somewhat effective.    Plan for left total hip revision.  PAST MEDICAL HISTORY:   Past Medical History:  Diagnosis Date   Acid reflux    Breast cancer ([place]) Left breast cancer with lymph node removal. Completed Chemo 2016. due to start radiation 11/03/15. Treated by Dr Sherre Lain in [facility])    DOE (dyspnea on exertion)    chronic   Dyspnea    chronic   Hiatal hernia    Hyperlipidemia    Hypertension    PE (pulmonary thromboembolism) ([place])    Vertigo     PAST SURGICAL HISTORY:   Past Surgical History:  Procedure Laterality Date   ABDOMINAL HYSTERECTOMY     TOTAL HIP ARTHROPLASTY     TOTAL KNEE ARTHROPLASTY      MEDICATIONS:  (Not in a hospital admission)   ALLERGIES:  No Known Allergies  REVIEW OF SYSTEMS:   Negative except HPI  FAMILY HISTORY:   Family History  Problem Relation Age of Onset   Hypertension Mother    Hypertension Father    Hypertension Other     SOCIAL HISTORY:   reports that she has never smoked. She has never used smokeless tobacco. She reports that she does not  drink alcohol and does not use drugs.  PHYSICAL EXAM:  General appearance: alert, cooperative, and no distress Neck: no JVD and supple, symmetrical, trachea midline Resp: clear to auscultation bilaterally Cardio: regular rate and rhythm, S1, S2 normal, no murmur, click, rub or gallop GI: soft, non-tender; bowel sounds normal; no masses,  no organomegaly Extremities: extremities normal, atraumatic, no cyanosis or edema and Homans sign is negative, no sign of DVT Pulses: 2+ and symmetric Skin: Skin color, texture, turgor normal. No rashes or lesions Neurologic: Alert and oriented X 3, normal strength and tone. Normal symmetric reflexes. Normal coordination and gait    LABORATORY STUDIES: No results for input(s): "WBC", "HGB", "HCT", "PLT" in the last 72 hours.  No results for input(s): "NA", "K", "CL", "CO2", "GLUCOSE", "BUN", "CREATININE", "CALCIUM" in the last 72 hours.  STUDIES/RESULTS:  No results found.  ASSESSMENT:  painful hardware left hip        Active Problems:   * No active hospital problems. *    PLAN:  Left Revision Total Hip   Carlynn Spry 05/05/2022. 11:30 AM

## 2022-05-17 ENCOUNTER — Other Ambulatory Visit: Payer: Medicare Other

## 2022-05-18 ENCOUNTER — Encounter (HOSPITAL_COMMUNITY): Payer: Self-pay | Admitting: Urgent Care

## 2022-05-18 ENCOUNTER — Encounter
Admission: RE | Admit: 2022-05-18 | Discharge: 2022-05-18 | Disposition: A | Discharge status: Home / Self Care | Payer: Medicare Other | Source: Ambulatory Visit | Attending: Orthopedic Surgery | Admitting: Orthopedic Surgery

## 2022-05-18 ENCOUNTER — Other Ambulatory Visit: Payer: Self-pay

## 2022-05-18 VITALS — BP 179/108 | HR 94 | Resp 18

## 2022-05-18 DIAGNOSIS — Z79899 Other long term (current) drug therapy: Secondary | ICD-10-CM | POA: Diagnosis not present

## 2022-05-18 DIAGNOSIS — Z01812 Encounter for preprocedural laboratory examination: Secondary | ICD-10-CM | POA: Insufficient documentation

## 2022-05-18 DIAGNOSIS — Z01818 Encounter for other preprocedural examination: Secondary | ICD-10-CM

## 2022-05-18 HISTORY — DX: Anemia, unspecified: D64.9

## 2022-05-18 HISTORY — DX: Headache, unspecified: R51.9

## 2022-05-18 HISTORY — DX: Unspecified osteoarthritis, unspecified site: M19.90

## 2022-05-18 HISTORY — DX: Depression, unspecified: F32.A

## 2022-05-18 LAB — CBC
HCT: 38.3 % (ref 36.0–46.0)
Hemoglobin: 12 g/dL (ref 12.0–15.0)
MCH: 26.9 pg (ref 26.0–34.0)
MCHC: 31.3 g/dL (ref 30.0–36.0)
MCV: 85.9 fL (ref 80.0–100.0)
Platelets: 279 10*3/uL (ref 150–400)
RBC: 4.46 MIL/uL (ref 3.87–5.11)
RDW: 15.7 % — ABNORMAL HIGH (ref 11.5–15.5)
WBC: 6.6 10*3/uL (ref 4.0–10.5)
nRBC: 0 % (ref 0.0–0.2)

## 2022-05-18 LAB — BASIC METABOLIC PANEL
Anion gap: 9 (ref 5–15)
BUN: 13 mg/dL (ref 8–23)
CO2: 25 mmol/L (ref 22–32)
Calcium: 9.7 mg/dL (ref 8.9–10.3)
Chloride: 106 mmol/L (ref 98–111)
Creatinine, Ser: 0.73 mg/dL (ref 0.44–1.00)
GFR, Estimated: >60 mL/min (ref >60)
Glucose, Bld: 95 mg/dL (ref 70–99)
Potassium: 3.2 mmol/L — ABNORMAL LOW (ref 3.5–5.1)
Sodium: 140 mmol/L (ref 135–145)

## 2022-05-18 LAB — URINALYSIS, ROUTINE W REFLEX MICROSCOPIC
Bilirubin Urine: NEGATIVE
Glucose, UA: NEGATIVE mg/dL
Hgb urine dipstick: NEGATIVE
Ketones, ur: NEGATIVE mg/dL
Nitrite: NEGATIVE
Protein, ur: NEGATIVE mg/dL
Specific Gravity, Urine: 1.019 (ref 1.005–1.030)
pH: 5 (ref 5.0–8.0)

## 2022-05-18 LAB — SURGICAL PCR SCREEN
MRSA, PCR: NEGATIVE
Staphylococcus aureus: NEGATIVE

## 2022-05-18 NOTE — Patient Instructions (Addendum)
Your procedure is scheduled on: 05/30/22 - Tuesday Report to the Registration Desk on the 1st floor of the [facility]. To find out your arrival time, please call [phone] between 1PM - 3PM on: 05/29/22 - Monday If your arrival time is 6:00 am, do not arrive prior to that time as the [facility] entrance doors do not open until 6:00 am.  REMEMBER: Instructions that are not followed completely may result in serious medical risk, up to and including death; or upon the discretion of your surgeon and anesthesiologist your surgery may need to be rescheduled.  Do not eat food or drink any fluids after midnight the night before surgery.  No gum chewing, lozengers or hard candies.   TAKE ONLY THESE MEDICATIONS THE MORNING OF SURGERY WITH A SIP OF WATER:  - amLODipine (NORVASC) - carvedilol (COREG) 6.25 MG tablet - capecitabine (XELODA) - potassium chloride SA  - hydrALAZINE (APRESOLINE)    STOP taking rivaroxaban (XARELTO) - Dr. Harlow Mares will give you this information when you see him on 05/24/22 .   One week prior to surgery: Stop Anti-inflammatories (NSAIDS) such as Advil, Aleve, Ibuprofen, Motrin, Naproxen, Naprosyn and Aspirin based products such as Excedrin, Goodys Powder, BC Powder.  Stop ANY OVER THE COUNTER supplements until after surgery.  You may however, continue to take Tylenol if needed for pain up until the day of surgery.  No Alcohol for 24 hours before or after surgery.  No Smoking including e-cigarettes for 24 hours prior to surgery.  No chewable tobacco products for at least 6 hours prior to surgery.  No nicotine patches on the day of surgery.  Do not use any "recreational" drugs for at least a week prior to your surgery.  Please be advised that the combination of cocaine and anesthesia may have negative outcomes, up to and including death. If you test positive for cocaine, your surgery will be cancelled.  On the morning of surgery brush your teeth with  toothpaste and water, you may rinse your mouth with mouthwash if you wish. Do not swallow any toothpaste or mouthwash.  Use CHG Soap or wipes as directed on instruction sheet.  Do not wear jewelry, make-up, hairpins, clips or nail polish.  Do not wear lotions, powders, or perfumes.   Do not shave body from the neck down 48 hours prior to surgery just in case you cut yourself which could leave a site for infection.  Also, freshly shaved skin may become irritated if using the CHG soap.  Contact lenses, hearing aids and dentures may not be worn into surgery.  Do not bring valuables to the hospital. [facility] is not responsible for any missing/lost belongings or valuables.   Notify your doctor if there is any change in your medical condition (cold, fever, infection).  Wear comfortable clothing (specific to your surgery type) to the hospital.  After surgery, you can help prevent lung complications by doing breathing exercises.  Take deep breaths and cough every 1-2 hours. Your doctor may order a device called an Incentive Spirometer to help you take deep breaths. When coughing or sneezing, hold a pillow firmly against your incision with both hands. This is called "splinting." Doing this helps protect your incision. It also decreases belly discomfort.  If you are being admitted to the hospital overnight, leave your suitcase in the car. After surgery it may be brought to your room.  If you are being discharged the day of surgery, you will not be allowed to drive  home. You will need a responsible adult (18 years or older) to drive you home and stay with you that night.   If you are taking public transportation, you will need to have a responsible adult (18 years or older) with you. Please confirm with your physician that it is acceptable to use public transportation.   Please call the [place] Dept. at [phone] if you have any questions about these  instructions.  Surgery Visitation Policy:  Patients undergoing a surgery or procedure may have two family members or support persons with them as long as the person is not COVID-19 positive or experiencing its symptoms.   Inpatient Visitation:    Visiting hours are 7 a.m. to 8 p.m. Up to four visitors are allowed at one time in a patient room, including children. The visitors may rotate out with other people during the day. One designated support person (adult) may remain overnight.

## 2022-05-19 LAB — TYPE AND SCREEN
ABO/RH(D): A NEG
Antibody Screen: NEGATIVE

## 2022-05-23 ENCOUNTER — Encounter: Payer: Self-pay | Admitting: Urgent Care

## 2022-05-30 ENCOUNTER — Encounter: Admission: RE | Payer: Self-pay | Source: Ambulatory Visit

## 2022-05-30 ENCOUNTER — Inpatient Hospital Stay: Admission: RE | Admit: 2022-05-30 | Payer: Medicare Other | Source: Ambulatory Visit | Admitting: Orthopedic Surgery

## 2022-05-30 SURGERY — TOTAL HIP REVISION
Anesthesia: Spinal | Site: Hip | Laterality: Left

## 2022-06-27 ENCOUNTER — Other Ambulatory Visit: Payer: Self-pay | Admitting: Orthopedic Surgery

## 2022-06-27 ENCOUNTER — Encounter: Payer: Self-pay | Admitting: Orthopedic Surgery

## 2022-06-27 DIAGNOSIS — Z01818 Encounter for other preprocedural examination: Secondary | ICD-10-CM

## 2022-06-27 NOTE — H&P (Deleted)
  The note originally documented on this encounter has been moved the the encounter in which it belongs.  

## 2022-06-27 NOTE — H&P (Signed)
NAME: STARLENE CONSUEGRA MRN:   [id number] DOB:   28-May-1951     HISTORY AND PHYSICAL  CHIEF COMPLAINT:  left hip pain  HISTORY:   Margaret Avila a 71 y.o. female  with left  Hip Pain Patient complains of left hip pain. Onset of the symptoms was several years ago. Inciting event: none. The patient reports the hip pain is worse with weight bearing. Associated symptoms: none. Aggravating symptoms include: any weight bearing, going up and down stairs, and inactivity. Patient has had prior hip problems. Previous visits for this problem: yes, last seen several weeks ago by Dr. Harlow Mares . Evaluation to date: plain films, which were abnormal    Treatment to date: OTC analgesics, which have been somewhat effective and prescription analgesics, which have been somewhat effective.  .  Plan for left total hip revision.  PAST MEDICAL HISTORY:   Past Medical History:  Diagnosis Date   (HFpEF) heart failure with preserved ejection fraction ([place])    a.) TTE 01/18/2017: EF 55-60%, mild LVH, degen MV disease, AoV sclerosis, triv TR/PR; b.) TTE 05/01/2021: EF 65-70%, mod LVH, mild AR, G1DD.   Acid reflux    Anemia    Anxiety    Arthritis    Cervical spondylosis    DDD (degenerative disc disease), lumbar    Depression    DOE (dyspnea on exertion)    DVT (deep venous thrombosis) (HCC)    Headache    Hiatal hernia    History of left breast cancer    a.) Tx'd with systemic chemotherapy (completed 2016) + XRT (began 10/2015) + surgical resection (mastectomy); Dr. Sherre Lain in [place], [place] (oncologist)   Hyperlipidemia    Hypertension    Illiteracy    Insomnia    Long term current use of anticoagulant    a.) rivaroxaban   Memory changes    Murmur    Palpitations    PE (pulmonary thromboembolism) (HCC)    TIA (transient ischemic attack)    Vertigo     PAST SURGICAL HISTORY:   Past Surgical History:  Procedure Laterality Date   ABDOMINAL HYSTERECTOMY     TOTAL HIP ARTHROPLASTY     TOTAL KNEE  ARTHROPLASTY      MEDICATIONS:  (Not in a hospital admission)   ALLERGIES:  No Known Allergies  REVIEW OF SYSTEMS:   Negative except HPI  FAMILY HISTORY:   Family History  Problem Relation Age of Onset   Hypertension Mother    Hypertension Father    Hypertension Other     SOCIAL HISTORY:   reports that she has never smoked. She has never used smokeless tobacco. She reports that she does not drink alcohol and does not use drugs.  PHYSICAL EXAM:  General appearance: alert, cooperative, and no distress Neck: no JVD and supple, symmetrical, trachea midline Resp: clear to auscultation bilaterally Cardio: regular rate and rhythm, S1, S2 normal, no murmur, click, rub or gallop GI: soft, non-tender; bowel sounds normal; no masses,  no organomegaly Extremities: extremities normal, atraumatic, no cyanosis or edema and Homans sign is negative, no sign of DVT Pulses: 2+ and symmetric Skin: Skin color, texture, turgor normal. No rashes or lesions Neurologic: Alert and oriented X 3, normal strength and tone. Normal symmetric reflexes. Normal coordination and gait    LABORATORY STUDIES: No results for input(s): "WBC", "HGB", "HCT", "PLT" in the last 72 hours.  No results for input(s): "NA", "K", "CL", "CO2", "GLUCOSE", "BUN", "CREATININE", "CALCIUM" in the last 72 hours.  STUDIES/RESULTS:  No results found.  ASSESSMENT:  Left hip hardware failure        Active Problems:   * No active hospital problems. *    PLAN:  Left total hip revision   Carlynn Spry 06/27/2022. 12:15 PM

## 2022-06-30 ENCOUNTER — Encounter
Admission: RE | Admit: 2022-06-30 | Discharge: 2022-06-30 | Disposition: A | Discharge status: Home / Self Care | Payer: Medicare Other | Source: Ambulatory Visit | Attending: Orthopedic Surgery | Admitting: Orthopedic Surgery

## 2022-06-30 ENCOUNTER — Other Ambulatory Visit: Payer: Medicare Other

## 2022-06-30 HISTORY — DX: Presence of other vascular implants and grafts: Z95.828

## 2022-06-30 NOTE — Patient Instructions (Addendum)
Your procedure is scheduled on:07-11-22 Tuesday Report to the Registration Desk on the 1st floor of the [facility].Then proceed to the 2nd floor Surgery Desk To find out your arrival time, please call [phone] between 1PM - 3PM on:07-10-22 Monday If your arrival time is 6:00 am, do not arrive prior to that time as the [facility] entrance doors do not open until 6:00 am.  REMEMBER: Instructions that are not followed completely may result in serious medical risk, up to and including death; or upon the discretion of your surgeon and anesthesiologist your surgery may need to be rescheduled.  Do not eat food after midnight the night before surgery.  No gum chewing, lozengers or hard candies.  You may however, drink CLEAR liquids up to 2 hours before you are scheduled to arrive for your surgery. Do not drink anything within 2 hours of your scheduled arrival time.  Clear liquids include: - water  - apple juice without pulp - gatorade (not RED colors) - black coffee or tea (Do NOT add milk or creamers to the coffee or tea) Do NOT drink anything that is not on this list  TAKE THESE MEDICATIONS THE MORNING OF SURGERY WITH A SIP OF WATER: -amLODipine (NORVASC)  -capecitabine (XELODA) -carvedilol (COREG)  -hydrALAZINE (APRESOLINE)   Call Dr Harlow Mares office on 07-03-22 (Monday) to find out when you need to stop your rivaroxaban (XARELTO)   One week prior to surgery: Stop Anti-inflammatories (NSAIDS) such as Advil, Aleve, Ibuprofen, Motrin, Naproxen, Naprosyn and Aspirin based products such as Excedrin, Goodys Powder, BC Powder.You may however, continue to take Tylenol if needed for pain up until the day of surgery.  Stop ANY OVER THE COUNTER supplements/vitamins 7 days prior to surgery-You may continue your Melatonin up until the night prior to surgery  No Alcohol for 24 hours before or after surgery.  No Smoking including e-cigarettes for 24 hours prior to surgery.  No chewable tobacco  products for at least 6 hours prior to surgery.  No nicotine patches on the day of surgery.  Do not use any "recreational" drugs for at least a week prior to your surgery.  Please be advised that the combination of cocaine and anesthesia may have negative outcomes, up to and including death. If you test positive for cocaine, your surgery will be cancelled.  On the morning of surgery brush your teeth with toothpaste and water, you may rinse your mouth with mouthwash if you wish. Do not swallow any toothpaste or mouthwash.  Use CHG wipes as directed on instruction sheet.  Do not wear jewelry, make-up, hairpins, clips or nail polish.  Do not wear lotions, powders, or perfumes.   Do not shave body from the neck down 48 hours prior to surgery just in case you cut yourself which could leave a site for infection.  Also, freshly shaved skin may become irritated if using the CHG soap.  Contact lenses, hearing aids and dentures may not be worn into surgery.  Do not bring valuables to the hospital. [facility] is not responsible for any missing/lost belongings or valuables.   Notify your doctor if there is any change in your medical condition (cold, fever, infection).  Wear comfortable clothing (specific to your surgery type) to the hospital.  After surgery, you can help prevent lung complications by doing breathing exercises.  Take deep breaths and cough every 1-2 hours. Your doctor may order a device called an Incentive Spirometer to help you take deep breaths. When coughing or  sneezing, hold a pillow firmly against your incision with both hands. This is called "splinting." Doing this helps protect your incision. It also decreases belly discomfort.  If you are being admitted to the hospital overnight, leave your suitcase in the car. After surgery it may be brought to your room.  If you are being discharged the day of surgery, you will not be allowed to drive home. You will need a  responsible adult (18 years or older) to drive you home and stay with you that night.   If you are taking public transportation, you will need to have a responsible adult (18 years or older) with you. Please confirm with your physician that it is acceptable to use public transportation.   Please call the [facility] at [phone] if you have any questions about these instructions.  Surgery Visitation Policy:  Patients undergoing a surgery or procedure may have two family members or support persons with them as long as the person is not COVID-19 positive or experiencing its symptoms.   Inpatient Visitation:    Visiting hours are 7 a.m. to 8 p.m. Up to four visitors are allowed at one time in a patient room, including children. The visitors may rotate out with other people during the day. One designated support person (adult) may remain overnight.    How to Use an Incentive Spirometer An incentive spirometer is a tool that measures how well you are filling your lungs with each breath. Learning to take long, deep breaths using this tool can help you keep your lungs clear and active. This may help to reverse or lessen your chance of developing breathing (pulmonary) problems, especially infection. You may be asked to use a spirometer: After a surgery. If you have a lung problem or a history of smoking. After a long period of time when you have been unable to move or be active. If the spirometer includes an indicator to show the highest number that you have reached, your health care provider or respiratory therapist will help you set a goal. Keep a log of your progress as told by your health care provider. What are the risks? Breathing too quickly may cause dizziness or cause you to pass out. Take your time so you do not get dizzy or light-headed. If you are in pain, you may need to take pain medicine before doing incentive spirometry. It is harder to take a deep breath if you  are having pain. How to use your incentive spirometer  Sit up on the edge of your bed or on a chair. Hold the incentive spirometer so that it is in an upright position. Before you use the spirometer, breathe out normally. Place the mouthpiece in your mouth. Make sure your lips are closed tightly around it. Breathe in slowly and as deeply as you can through your mouth, causing the piston or the ball to rise toward the top of the chamber. Hold your breath for 3-5 seconds, or for as long as possible. If the spirometer includes a coach indicator, use this to guide you in breathing. Slow down your breathing if the indicator goes above the marked areas. Remove the mouthpiece from your mouth and breathe out normally. The piston or ball will return to the bottom of the chamber. Rest for a few seconds, then repeat the steps 10 or more times. Take your time and take a few normal breaths between deep breaths so that you do not get dizzy or light-headed. Do this  every 1-2 hours when you are awake. If the spirometer includes a goal marker to show the highest number you have reached (best effort), use this as a goal to work toward during each repetition. After each set of 10 deep breaths, cough a few times. This will help to make sure that your lungs are clear. If you have an incision on your chest or abdomen from surgery, place a pillow or a rolled-up towel firmly against the incision when you cough. This can help to reduce pain while taking deep breaths and coughing. General tips When you are able to get out of bed: Walk around often. Continue to take deep breaths and cough in order to clear your lungs. Keep using the incentive spirometer until your health care provider says it is okay to stop using it. If you have been in the hospital, you may be told to keep using the spirometer at home. Contact a health care provider if: You are having difficulty using the spirometer. You have trouble using the  spirometer as often as instructed. Your pain medicine is not giving enough relief for you to use the spirometer as told. You have a fever. Get help right away if: You develop shortness of breath. You develop a cough with bloody mucus from the lungs. You have fluid or blood coming from an incision site after you cough. Summary An incentive spirometer is a tool that can help you learn to take long, deep breaths to keep your lungs clear and active. You may be asked to use a spirometer after a surgery, if you have a lung problem or a history of smoking, or if you have been inactive for a long period of time. Use your incentive spirometer as instructed every 1-2 hours while you are awake. If you have an incision on your chest or abdomen, place a pillow or a rolled-up towel firmly against your incision when you cough. This will help to reduce pain. Get help right away if you have shortness of breath, you cough up bloody mucus, or blood comes from your incision when you cough. This information is not intended to replace advice given to you by your health care provider. Make sure you discuss any questions you have with your health care provider. Document Revised: 12/15/2019 Document Reviewed: 12/15/2019 Elsevier Patient Education  [employer].

## 2022-07-03 ENCOUNTER — Encounter
Admission: RE | Admit: 2022-07-03 | Discharge: 2022-07-03 | Disposition: A | Discharge status: Home / Self Care | Payer: Medicare Other | Source: Ambulatory Visit | Attending: Orthopedic Surgery | Admitting: Orthopedic Surgery

## 2022-07-03 DIAGNOSIS — Z01818 Encounter for other preprocedural examination: Secondary | ICD-10-CM

## 2022-07-03 DIAGNOSIS — Z01812 Encounter for preprocedural laboratory examination: Secondary | ICD-10-CM | POA: Insufficient documentation

## 2022-07-03 LAB — BASIC METABOLIC PANEL
Anion gap: 10 (ref 5–15)
BUN: 13 mg/dL (ref 8–23)
CO2: 22 mmol/L (ref 22–32)
Calcium: 9.4 mg/dL (ref 8.9–10.3)
Chloride: 107 mmol/L (ref 98–111)
Creatinine, Ser: 0.72 mg/dL (ref 0.44–1.00)
GFR, Estimated: >60 mL/min (ref >60)
Glucose, Bld: 105 mg/dL — ABNORMAL HIGH (ref 70–99)
Potassium: 3.1 mmol/L — ABNORMAL LOW (ref 3.5–5.1)
Sodium: 139 mmol/L (ref 135–145)

## 2022-07-03 LAB — URINALYSIS, ROUTINE W REFLEX MICROSCOPIC
Bacteria, UA: NONE SEEN
Bilirubin Urine: NEGATIVE
Glucose, UA: NEGATIVE mg/dL
Ketones, ur: NEGATIVE mg/dL
Nitrite: NEGATIVE
Protein, ur: NEGATIVE mg/dL
Specific Gravity, Urine: 1.018 (ref 1.005–1.030)
pH: 6 (ref 5.0–8.0)

## 2022-07-06 ENCOUNTER — Encounter: Payer: Self-pay | Admitting: Orthopedic Surgery

## 2022-07-07 NOTE — Progress Notes (Signed)
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 Clinical nursing staff has updated patient's PMH/PSHx, current medication list, and drug allergies/intolerances to ensure comprehensive history available to assist in medical decision making as it pertains to the aforementioned surgical procedure and anticipated anesthetic course. Extensive review of available clinical information performed.  PMH and PSHx updated with any diagnoses/procedures that  may have been inadvertently omitted during her intake with the pre-admission testing department's nursing staff.  Clinical Discussion:  Margaret Avila is a 71 y.o. female who is submitted for pre-surgical anesthesia review and clearance prior to her undergoing the above procedure. Patient has never been a smoker. Pertinent PMH includes: CAD, HFpEF, cardiac murmur, HTN, HLD, TIA, DVT/multiple PE, DOE, GERD (no daily Tx), hiatal hernia, anemia, LEFT breast cancer (on oral capecitabine), OA, lumbar DDD, cervical spondylosis, depression, anxiety, insomnia, memory changes.  Patient is followed by cardiology Gennette Pac, MD). She was last seen in the cardiology clinic on 02/16/2022; notes reviewed.  At the time of her clinic visit, patient reporting progressive DOE to the point of feeling presyncopal with excessive exertion.  Patient also complains of "heart fluttering" with both activity and rest she denied any episodes of chest pain,  PND, orthopnea, significant peripheral edema, and actual episodes of syncope.  Past medical history significant for cardiovascular diagnoses.  Patient has undergone CT imaging of the chest in the past that has demonstrated calcifications within her coronary arteries.  Last TTE was performed on 05/29/2022 revealing a hyperdynamic left regular systolic function with an EF of 65-70%.  There was trivial to mild mitral valve regurgitation.  There was no evidence of a significant transvalvular gradient to suggest stenosis.  Last myocardial perfusion imaging study was performed on 05/29/2022 revealing a normal left ventricular systolic function with an EF of greater than 60%.  There was minimal coronary calcifications noted on CT attenuation correction images.  There was no evidence of stress-induced myocardial ischemia or arrhythmia; no scintigraphic evidence of scar.  Study determined to be normal and low risk.  Patient has a history of recurrent DVT and pulmonary emboli. She is on long-term anticoagulation therapy using rivaroxaban.  Patient reportedly compliant with therapy with no evidence or reports of GI bleeding.  Blood pressure reasonably controlled at 147/78 on currently prescribed CCB (amlodipine), beta-blocker (carvedilol), vasodilator (hydralazine), and ARB (losartan) therapies.  Patient is not currently taking any type of lipid-lowering therapies for her HLD diagnosis and ASCVD prevention.  She is not diabetic. Patient does not have an OSAH diagnosis.  Functional capacity limited by exertional dyspnea, breast cancer, and other medical comorbidities.  With that being said, patient still felt to be able to achieve at least 4 METS of activity without experiencing anginal/anginal equivalent symptoms.  Of note, patient remains on oral capecitabine for her breast cancer.  No changes were made to her medication regimen.  Patient to follow-up with outpatient cardiology in 3 months or sooner if needed.  Margaret Avila is scheduled for  an elective LEFT TOTAL HIP REVISION on 07/11/2022 with Dr. Kurtis Bushman, MD. Given patient's past medical history significant for cardiovascular diagnoses, presurgical cardiac clearance was sought by the performing surgeon's office and PAT team. Per cardiology, "this patient is optimized for surgery and may proceed with the planned procedural course with a MODERATE risk of significant perioperative cardiovascular complications". Again, this patient is on daily anticoagulation therapy.  She has been instructed on recommendations from her cardiologist for holding her rivaroxaban dose for 3 days prior to her procedure with plans to restart as soon as postoperative bleeding is felt to be minimized by her primary attending surgeon.  The patient is aware that her last dose of rivaroxaban should be on 07/07/2022.  Patient denies previous perioperative complications with anesthesia in the past.  In review her EMR, there are no records available for review pertaining to past procedural/anesthetic courses within the [facility] system.     05/18/2022    9:23 AM 05/18/2022    8:22 AM 02/08/2022    6:56 PM  Vitals with BMI  Systolic 938  182  Diastolic 993  90  Pulse 94 113 92    Providers/Specialists:   NOTE: Primary physician provider listed below. Patient may have been seen by APP or partner within same practice.   PROVIDER ROLE / SPECIALTY LAST Jayme Cloud, MD Orthopedics (Surgeon) 07/03/2022  Vesta Mixer (Inactive) Primary Care Provider 04/19/2022  Jacques Earthly, MD Cardiology 02/16/2022   Allergies:  Patient has no known allergies.  Current Home Medications:   No current facility-administered medications for this encounter.    amLODipine (NORVASC) 10 MG tablet   capecitabine (XELODA) 500 MG tablet   carvedilol (COREG) 6.25 MG tablet   gabapentin (NEURONTIN) 100 MG capsule   hydrALAZINE (APRESOLINE) 50 MG tablet   losartan (COZAAR) 25 MG  tablet   melatonin 5 MG TABS   rivaroxaban (XARELTO) 20 MG TABS tablet   History:   Past Medical History:  Diagnosis Date   (HFpEF) heart failure with preserved ejection fraction ([place])    a.) TTE 01/18/2017: EF 55-60%, mild LVH, degen MV disease, AoV sclerosis, triv TR/PR; b.) TTE 05/01/2021: EF 65-70%, mod LVH, mild AR, G1DD; c.) TTE 05/29/2022: EF 65-70%, triv-mild MR   Acid reflux    Anemia    Anxiety    Arthritis    Cervical spondylosis    Coronary artery calcification seen on CT scan    DDD (degenerative disc disease), lumbar    Depression    DOE (dyspnea on exertion)    DVT (deep venous thrombosis) ([place])    Headache    Hiatal hernia    History of left breast cancer    a.) Tx'd with systemic chemotherapy (completed 2016) + XRT (began 10/2015) + surgical resection (mastectomy); continues on oral capecitabine --> Dr. Sherre Lain in [place], [place] (oncologist)   Hyperlipidemia    Hypertension    Illiteracy    Insomnia    Long term current use of anticoagulant    a.) rivaroxaban   Memory changes    Multiple pulmonary emboli ([place])    Murmur    Palpitations    Port-A-Cath in place    TIA (transient ischemic attack)    Vertigo    Past Surgical History:  Procedure Laterality Date   ABDOMINAL HYSTERECTOMY     PORTA CATH INSERTION     TOTAL HIP ARTHROPLASTY     TOTAL KNEE ARTHROPLASTY     Family History  Problem Relation Age of Onset   Hypertension Mother    Hypertension Father    Hypertension Other    Social History   Tobacco Use   Smoking status: Never   Smokeless tobacco: Never  Substance Use Topics   Alcohol use: No   Drug use: No    Pertinent Clinical Results:  LABS: Labs reviewed: Acceptable for surgery.  Component Date Value Ref Range Status   Sodium 07/03/2022 139  135 - 145 mmol/L Final   Potassium 07/03/2022 3.1 (L)  3.5 - 5.1 mmol/L Final   Chloride 07/03/2022 107  98 - 111 mmol/L Final   CO2 07/03/2022 22  22 - 32 mmol/L Final   Glucose, Bld  07/03/2022 105 (H)  70 - 99 mg/dL Final   Glucose reference range applies only to samples taken after fasting for at least 8 hours.   BUN 07/03/2022 13  8 - 23 mg/dL Final   Creatinine, Ser 07/03/2022 0.72  0.44 - 1.00 mg/dL Final   Calcium 07/03/2022 9.4  8.9 - 10.3 mg/dL Final   GFR, Estimated 07/03/2022 >60  >60 mL/min Final   Comment: (NOTE) Calculated using the CKD-EPI Creatinine Equation (2021)    Anion gap 07/03/2022 10  5 - 15 Final   Performed at [facility], [place], [place] [address]   Color, Urine 07/03/2022 YELLOW (A)  YELLOW Final   APPearance 07/03/2022 CLEAR (A)  CLEAR Final   Specific Gravity, Urine 07/03/2022 1.018  1.005 - 1.030 Final   pH 07/03/2022 6.0  5.0 - 8.0 Final   Glucose, UA 07/03/2022 NEGATIVE  NEGATIVE mg/dL Final   Hgb urine dipstick 07/03/2022 SMALL (A)  NEGATIVE Final   Bilirubin Urine 07/03/2022 NEGATIVE  NEGATIVE Final   Ketones, ur 07/03/2022 NEGATIVE  NEGATIVE mg/dL Final   Protein, ur 07/03/2022 NEGATIVE  NEGATIVE mg/dL Final   Nitrite 07/03/2022 NEGATIVE  NEGATIVE Final   Leukocytes,Ua 07/03/2022 SMALL (A)  NEGATIVE Final   RBC / HPF 07/03/2022 0-5  0 - 5 RBC/hpf Final   WBC, UA 07/03/2022 11-20  0 - 5 WBC/hpf Final   Bacteria, UA 07/03/2022 NONE SEEN  NONE SEEN Final   Squamous Epithelial / LPF 07/03/2022 0-5  0 - 5 Final   Mucus 07/03/2022 PRESENT   Final   Performed at [facility], [address] [place], [place] [address]    ECG: Date: 02/08/2022 Time ECG obtained: 1731 PM Rate: 95 bpm Rhythm:  Sinus rhythm Axis (leads I and aVF): Normal Intervals: PR 147 ms. QRS 94 ms. QTc 456 ms. ST segment and T wave changes: No evidence of acute ST segment elevation or depression Comparison: Similar to previous tracing obtained on 05/01/2021    IMAGING / PROCEDURES: MYOCARDIAL PERFUSION IMAGING STUDY (LEXISCAN) performed on 05/29/2022 Normal left ventricular systolic function with a normal LVEF of greater  than 60% Normal myocardial thickening and wall motion Left ventricular cavity size normal SPECT images demonstrate homogenous tracer distribution throughout the myocardium No evidence of stress-induced myocardial ischemia or arrhythmia CT attenuation correction images demonstrate mild coronary calcification and that patient is s/p LEFT-sided mastectomy Normal low risk study  TRANSTHORACIC ECHOCARDIOGRAM performed on 05/29/2022 The left ventricle is normal in size with normal wall thickness.  The left ventricular systolic function is hyperdynamic, LVEF is visually estimated at 65-70%.  There is trivial to mild aortic regurgitation.  The right ventricle is normal in size, with normal systolic function.    NM 3P BONE W/SPECT CT performed  on 03/21/2022 Intense activity within the superior aspect of the LEFT acetabulum.  On CT portion exam, there is periprosthetic lucency in the superior LEFT acetabulum. Concern for prosthetic loosening in the superior LEFT acetabulum.  DIAGNOSTIC RADIOGRAPHS OF CHEST 2 VIEWS performed on 02/08/2022 Heart size and vascularity normal.  Lungs are clear without infiltrate or effusion. Port-A-Cath tip near the confluence of the innominate veins, unchanged.  Thoracic spondylosis without acute skeletal abnormality. No active cardiopulmonary disease.  Impression and Plan:  Margaret Avila has been referred for pre-anesthesia review and clearance prior to her undergoing the planned anesthetic and procedural courses. Available labs, pertinent testing, and imaging results were personally reviewed by me. This patient has been appropriately cleared by cardiology with an overall MODERATE risk of significant perioperative cardiovascular complications.  Based on clinical review performed today (07/10/22), barring any significant acute changes in the patient's overall condition, it is anticipated that she will be able to proceed with the planned surgical intervention. Any  acute changes in clinical condition may necessitate her procedure being postponed and/or cancelled. Patient will meet with anesthesia team (MD and/or CRNA) on the day of her procedure for preoperative evaluation/assessment. Questions regarding anesthetic course will be fielded at that time.   Pre-surgical instructions were reviewed with the patient during her PAT appointment and questions were fielded by PAT clinical staff. Patient was advised that if any questions or concerns arise prior to her procedure then she should return a call to PAT and/or her surgeon's office to discuss.  Honor Loh, MSN, APRN, FNP-C, CEN [facility]  Peri-operative Services Nurse Practitioner Phone: [phone] Fax: [phone] 07/10/22 9:00 AM  NOTE: This note has been prepared using Dragon dictation software. Despite my best ability to proofread, there is always the potential that unintentional transcriptional errors may still occur from this process.

## 2022-07-07 NOTE — Pre-Procedure Instructions (Signed)
Called pt to make sure surgeons office had called to tell her when to stop her Xarelto. Pt aware and states her last dose will be today 07-07-22 Friday

## 2022-07-11 ENCOUNTER — Encounter: Payer: Self-pay | Admitting: Orthopedic Surgery

## 2022-07-11 ENCOUNTER — Inpatient Hospital Stay
Admission: RE | Admit: 2022-07-11 | Discharge: 2022-07-14 | DRG: 467 | Disposition: A | Discharge status: Home Health | Payer: Medicare Other | Attending: Orthopedic Surgery | Admitting: Orthopedic Surgery

## 2022-07-11 ENCOUNTER — Encounter
Admission: RE | Disposition: A | Discharge status: Home Health | Payer: Self-pay | Source: Home / Self Care | Attending: Orthopedic Surgery

## 2022-07-11 ENCOUNTER — Inpatient Hospital Stay: Payer: Medicare Other | Admitting: Urgent Care

## 2022-07-11 ENCOUNTER — Inpatient Hospital Stay: Payer: Medicare Other

## 2022-07-11 ENCOUNTER — Other Ambulatory Visit: Payer: Self-pay

## 2022-07-11 DIAGNOSIS — Z96642 Presence of left artificial hip joint: Secondary | ICD-10-CM | POA: Diagnosis present

## 2022-07-11 DIAGNOSIS — Z90710 Acquired absence of both cervix and uterus: Secondary | ICD-10-CM | POA: Diagnosis not present

## 2022-07-11 DIAGNOSIS — Z86711 Personal history of pulmonary embolism: Secondary | ICD-10-CM | POA: Diagnosis not present

## 2022-07-11 DIAGNOSIS — G4700 Insomnia, unspecified: Secondary | ICD-10-CM | POA: Diagnosis present

## 2022-07-11 DIAGNOSIS — K219 Gastro-esophageal reflux disease without esophagitis: Secondary | ICD-10-CM | POA: Diagnosis present

## 2022-07-11 DIAGNOSIS — M5136 Other intervertebral disc degeneration, lumbar region: Secondary | ICD-10-CM | POA: Diagnosis not present

## 2022-07-11 DIAGNOSIS — I2510 Atherosclerotic heart disease of native coronary artery without angina pectoris: Secondary | ICD-10-CM | POA: Diagnosis present

## 2022-07-11 DIAGNOSIS — Z8249 Family history of ischemic heart disease and other diseases of the circulatory system: Secondary | ICD-10-CM

## 2022-07-11 DIAGNOSIS — Z96659 Presence of unspecified artificial knee joint: Secondary | ICD-10-CM | POA: Diagnosis not present

## 2022-07-11 DIAGNOSIS — E785 Hyperlipidemia, unspecified: Secondary | ICD-10-CM | POA: Diagnosis not present

## 2022-07-11 DIAGNOSIS — Z7901 Long term (current) use of anticoagulants: Secondary | ICD-10-CM

## 2022-07-11 DIAGNOSIS — F419 Anxiety disorder, unspecified: Secondary | ICD-10-CM | POA: Diagnosis present

## 2022-07-11 DIAGNOSIS — F32A Depression, unspecified: Secondary | ICD-10-CM | POA: Diagnosis not present

## 2022-07-11 DIAGNOSIS — M47812 Spondylosis without myelopathy or radiculopathy, cervical region: Secondary | ICD-10-CM | POA: Diagnosis present

## 2022-07-11 DIAGNOSIS — M24852 Other specific joint derangements of left hip, not elsewhere classified: Secondary | ICD-10-CM | POA: Diagnosis not present

## 2022-07-11 DIAGNOSIS — Z8673 Personal history of transient ischemic attack (TIA), and cerebral infarction without residual deficits: Secondary | ICD-10-CM | POA: Diagnosis not present

## 2022-07-11 DIAGNOSIS — I110 Hypertensive heart disease with heart failure: Secondary | ICD-10-CM | POA: Diagnosis present

## 2022-07-11 DIAGNOSIS — I5032 Chronic diastolic (congestive) heart failure: Secondary | ICD-10-CM | POA: Diagnosis not present

## 2022-07-11 DIAGNOSIS — Z853 Personal history of malignant neoplasm of breast: Secondary | ICD-10-CM

## 2022-07-11 HISTORY — DX: Atherosclerotic heart disease of native coronary artery without angina pectoris: I25.10

## 2022-07-11 HISTORY — PX: TOTAL HIP REVISION: SHX763

## 2022-07-11 HISTORY — DX: Other pulmonary embolism without acute cor pulmonale: I26.99

## 2022-07-11 LAB — TYPE AND SCREEN
ABO/RH(D): A NEG
Antibody Screen: NEGATIVE

## 2022-07-11 SURGERY — TOTAL HIP REVISION
Anesthesia: Spinal | Site: Hip | Laterality: Left

## 2022-07-11 MED ORDER — MIDAZOLAM HCL 5 MG/5ML IJ SOLN
INTRAMUSCULAR | Status: DC | PRN
  Administered 2022-07-11: 2 mg via INTRAVENOUS

## 2022-07-11 MED ORDER — BUPIVACAINE-EPINEPHRINE (PF) 0.25% -1:200000 IJ SOLN
INTRAMUSCULAR | Status: AC
  Filled 2022-07-11: qty 30

## 2022-07-11 MED ORDER — FENTANYL CITRATE (PF) 100 MCG/2ML IJ SOLN: 25.0000 ug | INTRAMUSCULAR | Status: DC | PRN

## 2022-07-11 MED ORDER — ALUM & MAG HYDROXIDE-SIMETH 200-200-20 MG/5ML PO SUSP: 30.0000 mL | ORAL | Status: DC | PRN

## 2022-07-11 MED ORDER — LACTATED RINGERS IV SOLN: INTRAVENOUS | Status: DC

## 2022-07-11 MED ORDER — DOCUSATE SODIUM 100 MG PO CAPS
100.0000 mg | ORAL_CAPSULE | Freq: Two times a day (BID) | ORAL | Status: DC
  Administered 2022-07-11 – 2022-07-14 (×5): 100 mg via ORAL
  Filled 2022-07-11 (×6): qty 1

## 2022-07-11 MED ORDER — 0.9 % SODIUM CHLORIDE (POUR BTL) OPTIME
TOPICAL | Status: DC | PRN
  Administered 2022-07-11: 500 mL

## 2022-07-11 MED ORDER — PROPOFOL 10 MG/ML IV BOLUS
INTRAVENOUS | Status: DC | PRN
  Administered 2022-07-11 (×2): 20 mg via INTRAVENOUS

## 2022-07-11 MED ORDER — TRANEXAMIC ACID-NACL 1000-0.7 MG/100ML-% IV SOLN
1000.0000 mg | INTRAVENOUS | Status: AC
  Administered 2022-07-11: 1000 mg via INTRAVENOUS

## 2022-07-11 MED ORDER — BUPIVACAINE-EPINEPHRINE (PF) 0.25% -1:200000 IJ SOLN
INTRAMUSCULAR | Status: DC | PRN
  Administered 2022-07-11: 30 mL

## 2022-07-11 MED ORDER — METOCLOPRAMIDE HCL 5 MG/ML IJ SOLN: 5.0000 mg | Freq: Three times a day (TID) | INTRAMUSCULAR | Status: DC | PRN

## 2022-07-11 MED ORDER — ONDANSETRON HCL 4 MG/2ML IJ SOLN
INTRAMUSCULAR | Status: DC | PRN
  Administered 2022-07-11: 4 mg via INTRAVENOUS

## 2022-07-11 MED ORDER — PROPOFOL 1000 MG/100ML IV EMUL
INTRAVENOUS | Status: AC
  Filled 2022-07-11: qty 100

## 2022-07-11 MED ORDER — RIVAROXABAN 10 MG PO TABS
10.0000 mg | ORAL_TABLET | Freq: Every day | ORAL | Status: DC
  Administered 2022-07-12 – 2022-07-14 (×3): 10 mg via ORAL
  Filled 2022-07-11 (×3): qty 1

## 2022-07-11 MED ORDER — POVIDONE-IODINE 10 % EX SWAB: 2.0000 | Freq: Once | CUTANEOUS | Status: DC

## 2022-07-11 MED ORDER — MELATONIN 5 MG PO TABS
5.0000 mg | ORAL_TABLET | Freq: Every day | ORAL | Status: DC
  Administered 2022-07-11 – 2022-07-12 (×2): 5 mg via ORAL
  Filled 2022-07-11 (×3): qty 1

## 2022-07-11 MED ORDER — CHLORHEXIDINE GLUCONATE 0.12 % MT SOLN
15.0000 mL | Freq: Once | OROMUCOSAL | Status: AC
  Administered 2022-07-11: 15 mL via OROMUCOSAL

## 2022-07-11 MED ORDER — MAGNESIUM CITRATE PO SOLN: 1.0000 | Freq: Once | ORAL | Status: DC | PRN

## 2022-07-11 MED ORDER — ONDANSETRON HCL 4 MG PO TABS: 4.0000 mg | ORAL_TABLET | Freq: Four times a day (QID) | ORAL | Status: DC | PRN

## 2022-07-11 MED ORDER — PHENYLEPHRINE HCL-NACL 20-0.9 MG/250ML-% IV SOLN
INTRAVENOUS | Status: DC | PRN
  Administered 2022-07-11: 40 ug/min via INTRAVENOUS

## 2022-07-11 MED ORDER — LOSARTAN POTASSIUM 50 MG PO TABS
50.0000 mg | ORAL_TABLET | ORAL | Status: DC
  Administered 2022-07-12 – 2022-07-14 (×2): 50 mg via ORAL
  Filled 2022-07-11 (×2): qty 1

## 2022-07-11 MED ORDER — METOCLOPRAMIDE HCL 5 MG PO TABS: 5.0000 mg | ORAL_TABLET | Freq: Three times a day (TID) | ORAL | Status: DC | PRN

## 2022-07-11 MED ORDER — CHLORHEXIDINE GLUCONATE 0.12 % MT SOLN
OROMUCOSAL | Status: AC
  Filled 2022-07-11: qty 15

## 2022-07-11 MED ORDER — MENTHOL 3 MG MT LOZG: 1.0000 | LOZENGE | OROMUCOSAL | Status: DC | PRN

## 2022-07-11 MED ORDER — KETOROLAC TROMETHAMINE 15 MG/ML IJ SOLN
7.5000 mg | Freq: Four times a day (QID) | INTRAMUSCULAR | Status: AC
  Administered 2022-07-11 – 2022-07-12 (×4): 7.5 mg via INTRAVENOUS
  Filled 2022-07-11 (×4): qty 1

## 2022-07-11 MED ORDER — MIDAZOLAM HCL 2 MG/2ML IJ SOLN
INTRAMUSCULAR | Status: AC
  Filled 2022-07-11: qty 2

## 2022-07-11 MED ORDER — BISACODYL 10 MG RE SUPP: 10.0000 mg | Freq: Every day | RECTAL | Status: DC | PRN

## 2022-07-11 MED ORDER — AMLODIPINE BESYLATE 10 MG PO TABS
10.0000 mg | ORAL_TABLET | ORAL | Status: DC
  Administered 2022-07-12 – 2022-07-14 (×2): 10 mg via ORAL
  Filled 2022-07-11 (×2): qty 1

## 2022-07-11 MED ORDER — CEFAZOLIN SODIUM-DEXTROSE 1-4 GM/50ML-% IV SOLN
1.0000 g | Freq: Four times a day (QID) | INTRAVENOUS | Status: AC
  Administered 2022-07-11 – 2022-07-12 (×2): 1 g via INTRAVENOUS
  Filled 2022-07-11 (×3): qty 50

## 2022-07-11 MED ORDER — HYDROCODONE-ACETAMINOPHEN 7.5-325 MG PO TABS
1.0000 | ORAL_TABLET | ORAL | Status: DC | PRN
  Administered 2022-07-11 – 2022-07-12 (×2): 2 via ORAL
  Filled 2022-07-11 (×2): qty 2
  Filled 2022-07-11: qty 1

## 2022-07-11 MED ORDER — MAGNESIUM HYDROXIDE 400 MG/5ML PO SUSP
30.0000 mL | Freq: Every day | ORAL | Status: DC | PRN
  Administered 2022-07-12: 30 mL via ORAL
  Filled 2022-07-11: qty 30

## 2022-07-11 MED ORDER — CARVEDILOL 3.125 MG PO TABS
6.2500 mg | ORAL_TABLET | Freq: Two times a day (BID) | ORAL | Status: DC
  Administered 2022-07-12 – 2022-07-14 (×5): 6.25 mg via ORAL
  Filled 2022-07-11 (×5): qty 2

## 2022-07-11 MED ORDER — FAMOTIDINE 20 MG PO TABS
20.0000 mg | ORAL_TABLET | Freq: Once | ORAL | Status: AC
  Administered 2022-07-11: 20 mg via ORAL

## 2022-07-11 MED ORDER — ORAL CARE MOUTH RINSE: 15.0000 mL | Freq: Once | OROMUCOSAL | Status: AC

## 2022-07-11 MED ORDER — ACETAMINOPHEN 325 MG PO TABS: 325.0000 mg | ORAL_TABLET | Freq: Four times a day (QID) | ORAL | Status: DC | PRN

## 2022-07-11 MED ORDER — FAMOTIDINE 20 MG PO TABS
ORAL_TABLET | ORAL | Status: AC
  Filled 2022-07-11: qty 1

## 2022-07-11 MED ORDER — SURGIPHOR WOUND IRRIGATION SYSTEM - OPTIME
TOPICAL | Status: DC | PRN
  Administered 2022-07-11: 450 mL via TOPICAL

## 2022-07-11 MED ORDER — CEFAZOLIN SODIUM-DEXTROSE 2-4 GM/100ML-% IV SOLN
INTRAVENOUS | Status: AC
  Filled 2022-07-11: qty 100

## 2022-07-11 MED ORDER — ONDANSETRON HCL 4 MG/2ML IJ SOLN: 4.0000 mg | Freq: Four times a day (QID) | INTRAMUSCULAR | Status: DC | PRN

## 2022-07-11 MED ORDER — PROPOFOL 500 MG/50ML IV EMUL
INTRAVENOUS | Status: DC | PRN
  Administered 2022-07-11: 100 ug/kg/min via INTRAVENOUS

## 2022-07-11 MED ORDER — CAPECITABINE 500 MG PO TABS: 1000.0000 mg/m2 | ORAL_TABLET | Freq: Two times a day (BID) | ORAL | Status: DC

## 2022-07-11 MED ORDER — PHENOL 1.4 % MT LIQD: 1.0000 | OROMUCOSAL | Status: DC | PRN

## 2022-07-11 MED ORDER — ACETAMINOPHEN 10 MG/ML IV SOLN
INTRAVENOUS | Status: DC | PRN
  Administered 2022-07-11: 1000 mg via INTRAVENOUS

## 2022-07-11 MED ORDER — GABAPENTIN 100 MG PO CAPS
100.0000 mg | ORAL_CAPSULE | Freq: Every day | ORAL | Status: DC
  Administered 2022-07-11 – 2022-07-13 (×3): 100 mg via ORAL
  Filled 2022-07-11 (×3): qty 1

## 2022-07-11 MED ORDER — DEXMEDETOMIDINE HCL IN NACL 200 MCG/50ML IV SOLN
INTRAVENOUS | Status: DC | PRN
  Administered 2022-07-11: 4 ug via INTRAVENOUS

## 2022-07-11 MED ORDER — SODIUM CHLORIDE 0.9 % IR SOLN
Status: DC | PRN
  Administered 2022-07-11: 3000 mL

## 2022-07-11 MED ORDER — CEFAZOLIN SODIUM-DEXTROSE 2-4 GM/100ML-% IV SOLN
2.0000 g | INTRAVENOUS | Status: AC
  Administered 2022-07-11: 2 g via INTRAVENOUS

## 2022-07-11 MED ORDER — TRANEXAMIC ACID-NACL 1000-0.7 MG/100ML-% IV SOLN
INTRAVENOUS | Status: AC
  Filled 2022-07-11: qty 100

## 2022-07-11 MED ORDER — MORPHINE SULFATE (PF) 2 MG/ML IV SOLN
0.5000 mg | INTRAVENOUS | Status: DC | PRN
  Administered 2022-07-11: 1 mg via INTRAVENOUS
  Filled 2022-07-11: qty 1

## 2022-07-11 MED ORDER — HYDRALAZINE HCL 50 MG PO TABS
50.0000 mg | ORAL_TABLET | Freq: Three times a day (TID) | ORAL | Status: DC
  Administered 2022-07-11 – 2022-07-13 (×5): 50 mg via ORAL
  Filled 2022-07-11 (×7): qty 1

## 2022-07-11 MED ORDER — ACETAMINOPHEN 10 MG/ML IV SOLN
INTRAVENOUS | Status: AC
  Filled 2022-07-11: qty 100

## 2022-07-11 MED ORDER — BUPIVACAINE HCL (PF) 0.5 % IJ SOLN
INTRAMUSCULAR | Status: DC | PRN
  Administered 2022-07-11: 3 mL

## 2022-07-11 MED ORDER — HYDROCODONE-ACETAMINOPHEN 5-325 MG PO TABS
1.0000 | ORAL_TABLET | ORAL | Status: DC | PRN
  Administered 2022-07-12 – 2022-07-14 (×3): 2 via ORAL
  Filled 2022-07-11 (×3): qty 2

## 2022-07-11 SURGICAL SUPPLY — 85 items
ADAPTER SLEEVE UNV CTAPER PL5 (Orthopedic Implant) IMPLANT
BAG DECANTER FOR FLEXI CONT (MISCELLANEOUS) IMPLANT
BLADE BOVIE TIP EXT 4 (BLADE) ×1 IMPLANT
BLADE SAGITTAL 25.0X1.19X90 (BLADE) ×1 IMPLANT
BNDG COHESIVE 6X5 TAN ST LF (GAUZE/BANDAGES/DRESSINGS) ×1 IMPLANT
BNDG ELASTIC 6X5.8 VLCR STR LF (GAUZE/BANDAGES/DRESSINGS) ×1 IMPLANT
BONE CANC CHIPS 20CC PCAN1/4 (Bone Implant) ×1 IMPLANT
BONE CANC CHIPS 40CC CAN1/2 (Bone Implant) ×1 IMPLANT
CHIPS CANC BONE 20CC PCAN1/4 (Bone Implant) ×1 IMPLANT
CHIPS CANC BONE 40CC CAN1/2 (Bone Implant) ×1 IMPLANT
CHLORAPREP W/TINT 26 (MISCELLANEOUS) ×2 IMPLANT
COVER BACK TABLE REUSABLE LG (DRAPES) ×1 IMPLANT
COVER HOLE (Hips) IMPLANT
CUP MOD REDAPT OD 68 (Cup) IMPLANT
DRAPE 3/4 80X56 (DRAPES) ×1 IMPLANT
DRAPE INCISE IOBAN 66X45 STRL (DRAPES) IMPLANT
DRAPE INCISE IOBAN 66X60 STRL (DRAPES) ×1 IMPLANT
DRAPE U-SHAPE 47X51 STRL (DRAPES) ×1 IMPLANT
DRSG AQUACEL AG ADV 3.5X10 (GAUZE/BANDAGES/DRESSINGS) ×1 IMPLANT
DRSG AQUACEL AG ADV 3.5X14 (GAUZE/BANDAGES/DRESSINGS) ×1 IMPLANT
ELECT CAUTERY BLADE 6.4 (BLADE) ×1 IMPLANT
ELECT REM PT RETURN 9FT ADLT (ELECTROSURGICAL) ×1
ELECTRODE REM PT RTRN 9FT ADLT (ELECTROSURGICAL) ×1 IMPLANT
GAUZE 4X4 16PLY ~~LOC~~+RFID DBL (SPONGE) ×1 IMPLANT
GAUZE PACK 2X3YD (PACKING) IMPLANT
GAUZE XEROFORM 1X8 LF (GAUZE/BANDAGES/DRESSINGS) ×1 IMPLANT
GAUZE XEROFORM 4X4 STRL (GAUZE/BANDAGES/DRESSINGS) ×1 IMPLANT
GLOVE BIO SURGEON STRL SZ8 (GLOVE) ×1 IMPLANT
GLOVE BIOGEL PI IND STRL 8.5 (GLOVE) ×2 IMPLANT
GLOVE SURG ORTHO 8.5 STRL (GLOVE) ×1 IMPLANT
GOWN STRL REUS W/ TWL LRG LVL3 (GOWN DISPOSABLE) ×1 IMPLANT
GOWN STRL REUS W/ TWL XL LVL3 (GOWN DISPOSABLE) ×1 IMPLANT
GOWN STRL REUS W/TWL LRG LVL3 (GOWN DISPOSABLE) ×1
GOWN STRL REUS W/TWL XL LVL3 (GOWN DISPOSABLE) ×1
GRAFT BNE CANC CHIPS 1-8 20CC (Bone Implant) IMPLANT
GRAFT BNE CHIP CANC 1-8 40 (Bone Implant) IMPLANT
HEAD FEM BIOLOX DELTA UN 28 (Orthopedic Implant) IMPLANT
HEMOVAC 400CC 10FR (MISCELLANEOUS) IMPLANT
HOLDER FOLEY CATH W/STRAP (MISCELLANEOUS) ×1 IMPLANT
HOLSTER ELECTROSUGICAL PENCIL (MISCELLANEOUS) ×1 IMPLANT
HOOD W/PEELAWAY (MISCELLANEOUS) IMPLANT
IV NS 100ML SINGLE PACK (IV SOLUTION) IMPLANT
IV NS IRRIG 3000ML ARTHROMATIC (IV SOLUTION) ×1 IMPLANT
KIT TURNOVER KIT A (KITS) ×1 IMPLANT
LINER DUAL MOBILITY 28X52 (Liner) IMPLANT
LINER DUAL MOBILITY 52 66-70 (Liner) IMPLANT
MANIFOLD NEPTUNE II (INSTRUMENTS) ×1 IMPLANT
MAT ABSORB  FLUID 56X50 GRAY (MISCELLANEOUS) ×1
MAT ABSORB FLUID 56X50 GRAY (MISCELLANEOUS) ×1 IMPLANT
NDL SAFETY ECLIP 18X1.5 (MISCELLANEOUS) ×1 IMPLANT
NEEDLE HYPO 22GX1.5 SAFETY (NEEDLE) ×1 IMPLANT
NS IRRIG 1000ML POUR BTL (IV SOLUTION) ×1 IMPLANT
PACK HIP PROSTHESIS (MISCELLANEOUS) ×1 IMPLANT
PAD ARMBOARD 7.5X6 YLW CONV (MISCELLANEOUS) ×1 IMPLANT
PILLOW ABDUCTION FOAM SM (MISCELLANEOUS) IMPLANT
PILLOW ABDUCTION MEDIUM (MISCELLANEOUS) IMPLANT
PRESSURIZER CEMENT PROX FEM SM (MISCELLANEOUS) IMPLANT
PRESSURIZER FEM CANAL M (MISCELLANEOUS) IMPLANT
PULSAVAC PLUS IRRIG FAN TIP (DISPOSABLE) ×1
PUTTY DBX 1CC (Putty) ×1 IMPLANT
PUTTY DBX 1CC DEPUY (Putty) IMPLANT
RETRIEVER SUT HEWSON (MISCELLANEOUS) ×1 IMPLANT
SCREW 6.5X30MM (Screw) IMPLANT
SCREW LOCK REDAPT 20L +0L HIP (Screw) IMPLANT
SCREW LOCK REDAPT 4.5D 25L HIP (Screw) IMPLANT
SCREW LOCKING 15 REDAPT (Screw) IMPLANT
SOLUTION IRRIG SURGIPHOR (IV SOLUTION) ×1 IMPLANT
SPONGE T-LAP 18X18 ~~LOC~~+RFID (SPONGE) ×4 IMPLANT
STAPLER SKIN PROX 35W (STAPLE) ×1 IMPLANT
SUT DVC 2 QUILL PDO  T11 36X36 (SUTURE) ×1
SUT DVC 2 QUILL PDO T11 36X36 (SUTURE) IMPLANT
SUT ETHIBOND #5 BRAIDED 30INL (SUTURE) ×1 IMPLANT
SUT MERSILENE 5MM BP 1 12 (SUTURE) IMPLANT
SUT VIC AB 2-0 CT1 18 (SUTURE) ×2 IMPLANT
SUT VIC AB 2-0 CT1 27 (SUTURE)
SUT VIC AB 2-0 CT1 TAPERPNT 27 (SUTURE) ×1 IMPLANT
SUT VIC AB PLUS 45CM 1-MO-4 (SUTURE) IMPLANT
SYR 20ML LL LF (SYRINGE) ×1 IMPLANT
SYR TB 1ML 27GX1/2 LL (SYRINGE) IMPLANT
TIP BRUSH PULSAVAC PLUS 24.33 (MISCELLANEOUS) ×1 IMPLANT
TIP FAN IRRIG PULSAVAC PLUS (DISPOSABLE) ×1 IMPLANT
TOWER CARTRIDGE SMART MIX (DISPOSABLE) IMPLANT
TRAP FLUID SMOKE EVACUATOR (MISCELLANEOUS) ×1 IMPLANT
WAND WEREWOLF FASTSEAL 6.0 (MISCELLANEOUS) ×1 IMPLANT
WATER STERILE IRR 500ML POUR (IV SOLUTION) ×1 IMPLANT

## 2022-07-11 NOTE — Anesthesia Procedure Notes (Signed)
Procedure Name: MAC Date/Time: 07/11/2022 1:20 PM  Performed by: Lily Peer, Laureen Frederic, CRNAPre-anesthesia Checklist: Suction available, Emergency Drugs available, Patient identified, Patient being monitored and Timeout performed Oxygen Delivery Method: Simple face mask Induction Type: IV induction

## 2022-07-11 NOTE — Transfer of Care (Signed)
Immediate Anesthesia Transfer of Care Note  Patient: Margaret Avila  Procedure(s) Performed: TOTAL HIP REVISION (Left: Hip)  Patient Location: PACU  Anesthesia Type:Spinal  Level of Consciousness: awake, alert  and oriented  Airway & Oxygen Therapy: Patient Spontanous Breathing and Patient connected to face mask oxygen  Post-op Assessment: Report given to RN and Post -op Vital signs reviewed and stable  Post vital signs: Reviewed and stable  Last Vitals:  Vitals Value Taken Time  BP 109/77 07/11/22 1643  Temp    Pulse 88 07/11/22 1645  Resp 18 07/11/22 1645  SpO2 100 % 07/11/22 1645  Vitals shown include unvalidated device data.  Last Pain:  Vitals:   07/11/22 1041  TempSrc: Oral         Complications: No notable events documented.

## 2022-07-11 NOTE — Anesthesia Preprocedure Evaluation (Signed)
Anesthesia Evaluation  Patient identified by MRN, date of birth, ID band Patient awake    Reviewed: Allergy & Precautions, H&P , NPO status , Patient's Chart, lab work & pertinent test results, reviewed documented beta blocker date and time   History of Anesthesia Complications Negative for: history of anesthetic complications  Airway Mallampati: III  TM Distance: >3 FB Neck ROM: full    Dental  (+) Dental Advidsory Given, Poor Dentition, Missing   Pulmonary neg pulmonary ROS,    Pulmonary exam normal breath sounds clear to auscultation       Cardiovascular Exercise Tolerance: Good hypertension, + CAD and + DVT  (-) Past MI and (-) Cardiac Stents Normal cardiovascular exam(-) dysrhythmias + Valvular Problems/Murmurs  Rhythm:regular Rate:Normal     Neuro/Psych neg Seizures PSYCHIATRIC DISORDERS Anxiety Depression TIA   GI/Hepatic Neg liver ROS, hiatal hernia, GERD  ,  Endo/Other  negative endocrine ROS  Renal/GU negative Renal ROS  negative genitourinary   Musculoskeletal   Abdominal   Peds  Hematology negative hematology ROS (+)   Anesthesia Other Findings Past Medical History: No date: (HFpEF) heart failure with preserved ejection fraction ([place])     Comment:  a.) TTE 01/18/2017: EF 55-60%, mild LVH, degen MV               disease, AoV sclerosis, triv TR/PR; b.) TTE 05/01/2021:               EF 65-70%, mod LVH, mild AR, G1DD; c.) TTE 05/29/2022: EF              65-70%, triv-mild MR No date: Acid reflux No date: Anemia No date: Anxiety No date: Arthritis No date: Cervical spondylosis No date: Coronary artery calcification seen on CT scan No date: DDD (degenerative disc disease), lumbar No date: Depression No date: DOE (dyspnea on exertion) No date: DVT (deep venous thrombosis) ([facility]) No date: Headache No date: Hiatal hernia No date: History of left breast cancer     Comment:  a.) Tx'd with systemic  chemotherapy (completed 2016) +               XRT (began 10/2015) + surgical resection (mastectomy);               continues on oral capecitabine --> Dr. Sherre Lain in [place],              [place] (oncologist) No date: Hyperlipidemia No date: Hypertension No date: Illiteracy No date: Insomnia No date: Long term current use of anticoagulant     Comment:  a.) rivaroxaban No date: Memory changes No date: Multiple pulmonary emboli ([facility]) No date: Murmur No date: Palpitations No date: Port-A-Cath in place No date: TIA (transient ischemic attack) No date: Vertigo   Reproductive/Obstetrics negative OB ROS                             Anesthesia Physical Anesthesia Plan  ASA: 3  Anesthesia Plan: Spinal   Post-op Pain Management:    Induction:   PONV Risk Score and Plan: Propofol infusion and TIVA  Airway Management Planned: Natural Airway and Simple Face Mask  Additional Equipment:   Intra-op Plan:   Post-operative Plan:   Informed Consent: I have reviewed the patients History and Physical, chart, labs and discussed the procedure including the risks, benefits and alternatives for the proposed anesthesia with the patient or authorized representative who has indicated his/her understanding and acceptance.  Dental Advisory Given  Plan Discussed with: Anesthesiologist, CRNA and Surgeon  Anesthesia Plan Comments:         Anesthesia Quick Evaluation

## 2022-07-11 NOTE — Anesthesia Procedure Notes (Addendum)
Spinal  Patient location during procedure: OR Start time: 07/11/2022 1:10 PM End time: 07/11/2022 1:20 PM Reason for block: surgical anesthesia Staffing Performed: resident/CRNA  Anesthesiologist: Martha Clan, MD Resident/CRNA: Norm Salt, CRNA Performed by: Lily Peer, Deiondre Harrower, CRNA Authorized by: Martha Clan, MD   Preanesthetic Checklist Completed: patient identified, IV checked, site marked, risks and benefits discussed, surgical consent, monitors and equipment checked, pre-op evaluation and timeout performed Spinal Block Patient position: sitting Prep: ChloraPrep Patient monitoring: heart rate, continuous pulse ox and blood pressure Approach: midline Location: L3-4 Injection technique: single-shot Needle Needle type: Pencan  Needle gauge: 25 G Needle length: 10 cm Assessment Events: CSF return

## 2022-07-11 NOTE — H&P (Signed)
The patient has been re-examined, and the chart reviewed, and there have been no interval changes to the documented history and physical.  Plan a left hip revision today.  Anesthesia is not consulted regarding a peripheral nerve block for post-operative pain.  The risks, benefits, and alternatives have been discussed at length, and the patient is willing to proceed.

## 2022-07-11 NOTE — Op Note (Signed)
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  The risks benefits and alternatives were discussed with the patient including but not limited to the risks of nonoperative treatment, versus surgical intervention including infection, bleeding, nerve injury, periprosthetic fracture, the need for revision surgery, dislocation, leg length discrepancy, blood clots, cardiopulmonary complications, morbidity, mortality, among others, and they were willing to proceed.     OPERATIVE REPORT     SURGEON: Elyn Aquas. Harlow Mares, MD    ASSISTANT:    Carlynn Spry, [facility]  ANESTHESIA: Spinal    COMPLICATIONS:  None.   DRAINS: None  EBL:   200 mL                           COMPONENTS:  [employer]ker C-Taper Sleeve +5 mm with  28 mm  Biolox delta Femoral Head, OR3O 28 mm ID by 52 mm OD XLPE dual mobility insert, 68 mm REDAPT modular shell with 3 locking screws and 1 compression screw, OR3O 52 mm ID, 66-70 MM OD Oxinium Liner    PROCEDURE IN DETAIL:   The patient was met in the holding area and  identified.  The appropriate hip was identified and marked at the operative site.  The patient was then transported to the OR  and  placed under spinall anesthesia.  At that point, the patient was  placed in the lateral decubitus position with the operative side up and  secured to the operating room table and all bony prominences padded.     The operative lower extremity was prepped from the iliac crest to the distal leg.  Sterile  draping was performed.  Time out was performed prior to incision.      A routine posterolateral approach was utilized via sharp dissection  carried down to the subcutaneous tissue.  Gross bleeders were Bovie coagulated.  The iliotibial band was identified and incised along the length of the skin incision.  Self-retaining Charnley retractor was inserted.  With the hip internally rotated, the short external rotators  were identified, released, and tagged.  Old suture material was removed.    The hip was then dislocated posteriorly and the femoral removed with a bone tamp. The trunion was placed anterior to the acetabulum. The prior shell was grossly loose with abundant fluid and broken screws. The entire construct was passed from the field. Three broken screws remained within the pelvis and each carefully removed with osteotomes, rongeur, and trephine. This took considerable time given the prior surgery's placement through the inner table of the pelvis.  I then exposed the deep acetabulum, with evidence of severe bone loss. After adequate visualization, and removal of all screws, I used a curette to debride the bone. The large defects were filled with cancellous chips and DBM. A 67 mm reamer on reverse was performed. I placed the trial acetabulum, which seated nicely, and then impacted the real 68 mm cup into place.  Appropriate version and inclination was confirmed clinically matching their bony anatomy, and also  with the use of the jig.  The trial liner was placed and head. There was excellent stability. The trials were removed and the final implants were then inserted.    The hip was then reduced and taken through functional range of motion and found to have excellent stability. Leg lengths were restored.  I closed the T in the capsule. And repaired the posterior capsule with #1 Vicryl  I then irrigated the hip copiously again with pulse lavage, and repaired the fascia with #2 Quill, followed by 0  Quill for the subcutaneous tissue, and staplesl for the skin, Steri-Strips and Aquacel The wounds were injected. Sponge and needle counts were correct.  The patient was then awakened and returned to PACU in stable and satisfactory condition. There were no complications.

## 2022-07-11 NOTE — Anesthesia Postprocedure Evaluation (Signed)
Anesthesia Post Note  Patient: Margaret Avila  Procedure(s) Performed: TOTAL HIP REVISION (Left: Hip)  Patient location during evaluation: PACU Anesthesia Type: Spinal Level of consciousness: awake and alert Pain management: pain level controlled Vital Signs Assessment: post-procedure vital signs reviewed and stable Respiratory status: spontaneous breathing, nonlabored ventilation, respiratory function stable and patient connected to nasal cannula oxygen Cardiovascular status: blood pressure returned to baseline and stable Postop Assessment: no apparent nausea or vomiting Anesthetic complications: no   No notable events documented.   Last Vitals:  Vitals:   07/11/22 1800 07/11/22 2028  BP: (!) 124/90 133/76  Pulse: 86 86  Resp: 17 20  Temp: 36.4 C 36.8 C  SpO2: 99% 100%    Last Pain:  Vitals:   07/11/22 1955  TempSrc:   PainSc: [place]

## 2022-07-12 ENCOUNTER — Encounter: Payer: Self-pay | Admitting: Orthopedic Surgery

## 2022-07-12 DIAGNOSIS — M24852 Other specific joint derangements of left hip, not elsewhere classified: Secondary | ICD-10-CM | POA: Diagnosis not present

## 2022-07-12 DIAGNOSIS — Z96642 Presence of left artificial hip joint: Secondary | ICD-10-CM | POA: Diagnosis not present

## 2022-07-12 NOTE — Progress Notes (Signed)
  Subjective:  Patient reports pain as mild to moderate.    Objective:   VITALS:   Vitals:   07/11/22 1800 07/11/22 2028 07/12/22 0007 07/12/22 0424  BP: (!) 124/90 133/76 121/67 116/61  Pulse: 86 86 100 91  Resp: _0 Temp: 97.6 F (36.4 C) 98.3 F (36.8 C) 98.4 F (36.9 C) (!) 97 F (36.1 C)  TempSrc:      SpO2: 99% 100% 100% 100%  Weight:      Height:        PHYSICAL EXAM:  Neurologically intact ABD soft Neurovascular intact Sensation intact distally Intact pulses distally Dorsiflexion/Plantar flexion intact Incision: dressing C/D/I No cellulitis present Compartment soft  LABS  Results for orders placed or performed during the hospital encounter of 07/11/22 (from the past 24 hour(s))  Type and screen [place]     Status: None   Collection Time: 07/11/22 10:51 AM  Result Value Ref Range   ABO/RH(D) A NEG    Antibody Screen NEG    Sample Expiration      07/14/2022,2359 Performed at [facility], [address] [place], [place] [address]     DG HIP PORT UNILAT WITH PELVIS 1V LEFT  Result Date: 07/11/2022 CLINICAL DATA:  Revision of left hip arthroplasty EXAM: DG HIP (WITH OR WITHOUT PELVIS) 1V PORT LEFT COMPARISON:  10/24/2015, 03/21/2022 FINDINGS: Frontal view of the pelvis as well as a cross-table lateral view of the left hip are obtained. Interval revision of the left hip arthroplasty. There is a screwed in acetabular component, and a Press-Fit femoral component in the expected position. The small screw fragment within the left acetabular roof may be related to prior orthopedic hardware. Right hip is stable. Remainder of the bony pelvis is unremarkable. IMPRESSION: 1. Postsurgical changes from left hip arthroplasty revision. Electronically Signed   By: Randa Ngo M.D.   On: 07/11/2022 19:12    Assessment/Plan: 1 Day Post-Op   Principal Problem:   History of revision of total replacement of left hip  joint   Advance diet Up with therapy Anticipated D/C home tomorrow with home health PT if PT goals met    Carlynn Spry , PA-C 07/12/2022, 7:21 AM

## 2022-07-12 NOTE — Plan of Care (Signed)

## 2022-07-12 NOTE — Progress Notes (Signed)
Spoke with the patient in the room She lives alone at home but her daughter will be staying with her, her daughter in law to provide transportation She has had [facility] in the past I called [facility] and  reached out to [place] to set up with [facility] again, (they bought [place]) She has a rolling walker at home She needs a 3 in1 She asked about a hospital bed, I explained for a hip surgery Insurance may not cover a bed and it might be out of pocket cost, she stated that she did not want it

## 2022-07-12 NOTE — Evaluation (Signed)
Occupational Therapy Evaluation Patient Details Name: Margaret Avila MRN: [id number] DOB: Aug 29, 1951 Today's Date: 07/12/2022   History of Present Illness Pt admitted for L THR revision. History of 2 previous THRs at another hospital. Pt reports she was ambulating on Sunday when this hip gave out and she was unable to ambulate. Limited PMH available in chart.   Clinical Impression   Patient seen for OT evaluation. Daughter present. Patient presenting with decreased endurance, strength, and balance impacting safety and independence in ADLs. At baseline, pt was independent in ADLs, received assistance from daughter for IADLs, and was Mod I for functional mobility using a RW. Patient currently functioning at [facility] A for supine <> sit, Min guard for functional mobility to the bathroom, and Min guard for toilet transfer using BSC frame over toilet. Pt and daughter were educated on PWB LLE, posterior hip precautions, and LB dressing AE. Pt required verbal cues throughout session to maintain hip precautions. Patient will benefit from acute OT to increase overall independence in the areas of ADLs and functional mobility in order to safely discharge home. Pt could benefit from [facility] following D/C to decrease falls risk, improve balance, and maximize independence in self-care within own home environment.      Recommendations for follow up therapy are one component of a multi-disciplinary discharge planning process, led by the attending physician.  Recommendations may be updated based on patient status, additional functional criteria and insurance authorization.   Follow Up Recommendations  Home health OT    Assistance Recommended at Discharge Intermittent Supervision/Assistance  Patient can return home with the following A little help with walking and/or transfers;A little help with bathing/dressing/bathroom;Help with stairs or ramp for entrance;Assistance with cooking/housework;Assist for transportation     Functional Status Assessment  Patient has had a recent decline in their functional status and demonstrates the ability to make significant improvements in function in a reasonable and predictable amount of time.  Equipment Recommendations  BSC/3in1;Tub/shower bench;Other (comment) (hip kit)    Recommendations for Other Services       Precautions / Restrictions Precautions Precautions: Fall;Posterior Hip Restrictions Weight Bearing Restrictions: Yes LLE Weight Bearing: Partial weight bearing LLE Partial Weight Bearing Percentage or Pounds: 50      Mobility Bed Mobility Overal bed mobility: Needs Assistance Bed Mobility: Supine to Sit, Sit to Supine     Supine to sit: Min assist Sit to supine: Min assist   General bed mobility comments: follows commands well and able to use B UE for trunkal elevation. Needs assist for B LE progression. Once seated, needs frequent reminders about precautions.    Transfers Overall transfer level: Needs assistance Equipment used: Rolling walker (2 wheels) Transfers: Sit to/from Stand Sit to Stand: Min guard           General transfer comment: from EOB and toilet, VC for hand placement and sequencing to maintain hip precautions      Balance Overall balance assessment: Needs assistance Sitting-balance support: Feet supported Sitting balance-Leahy Scale: Good     Standing balance support: Bilateral upper extremity supported Standing balance-Leahy Scale: Fair                             ADL either performed or assessed with clinical judgement   ADL Overall ADL's : Needs assistance/impaired                     Lower Body Dressing: With adaptive equipment;Set  up;Cueing for compensatory techniques;Adhering to hip precautions;Sitting/lateral leans;Supervision/safety;Minimal assistance Lower Body Dressing Details (indicate cue type and reason): set up and VC to don/doff mesh underwear with reacher, Min A to don socks  with sock aid Toilet Transfer: Min guard;Ambulation;BSC/3in1;Regular Toilet;Rolling walker (2 wheels) Toilet Transfer Details (indicate cue type and reason): use of BSC frame over regular toilet   Toileting - Clothing Manipulation Details (indicate cue type and reason): anticipate assistance required for posterior hygiene to maintain hip precautions     Functional mobility during ADLs: Min guard;Rolling walker (2 wheels) (to bathroom)       Vision Baseline Vision/History: 1 Wears glasses (reading only) Patient Visual Report: No change from baseline       Perception     Praxis      Pertinent Vitals/Pain Pain Assessment Pain Assessment: No/denies pain     Hand Dominance Right   Extremity/Trunk Assessment Upper Extremity Assessment Upper Extremity Assessment: Overall WFL for tasks assessed   Lower Extremity Assessment Lower Extremity Assessment: Generalized weakness       Communication Communication Communication: No difficulties   Cognition Arousal/Alertness: Awake/alert Behavior During Therapy: WFL for tasks assessed/performed Overall Cognitive Status: Within Functional Limits for tasks assessed                                 General Comments: A&Ox4, slow processing, VC throughout to maintain hip precautions     General Comments       Exercises Other Exercises Other Exercises: OT provided education re: role of OT, OT POC, post acute recs, sitting up for all meals, EOB/OOB mobility with assistance, home/fall safety, PWB LLE, posterior hip precautions, LB dressing AE, energy conservation, safe showering equipment (tub bench, handheld shower head).    Shoulder Instructions      Home Living Family/patient expects to be discharged to:: Private residence Living Arrangements: Children Available Help at Discharge: Family;Available 24 hours/day Type of Home: House Home Access: Stairs to enter [employer] of Steps: 1 Entrance Stairs-Rails:  None Home Layout: Two level;Bed/bath upstairs Alternate Level Stairs-Number of Steps: 15 Alternate Level Stairs-Rails: Can reach both Bathroom Shower/Tub: Teacher, early years/pre: Standard     Home Equipment: Conservation officer, nature (2 wheels)   Additional Comments: plans to sleep upstairs when cleared, however can sleep on main floor in chair if needed      Prior Functioning/Environment Prior Level of Function : Independent/Modified Independent             Mobility Comments: has been ambulatory with RW for awhile, reports no recent falls. Not driving ADLs Comments: IND with ADLs, daughter asssits with IADLs (cooking, cleaning). Pt not driving anymore and reports daughter is not driving either, relies on medical transportation to appointments.        OT Problem List: Decreased strength;Decreased knowledge of use of DME or AE;Decreased activity tolerance;Impaired balance (sitting and/or standing);Decreased knowledge of precautions      OT Treatment/Interventions: Self-care/ADL training;Therapeutic exercise;Patient/family education;Balance training;Energy conservation;Therapeutic activities;DME and/or AE instruction    OT Goals(Current goals can be found in the care plan section) Acute Rehab OT Goals Patient Stated Goal: go home OT Goal Formulation: With patient/family Time For Goal Achievement: 07/26/22 Potential to Achieve Goals: Good  OT Frequency: Min 2X/week    Co-evaluation              AM-PAC OT "6 Clicks" Daily Activity     Outcome Measure Help from  another person eating meals?: None Help from another person taking care of personal grooming?: A Little Help from another person toileting, which includes using toliet, bedpan, or urinal?: A Little Help from another person bathing (including washing, rinsing, drying)?: A Lot Help from another person to put on and taking off regular upper body clothing?: None Help from another person to put on and taking off  regular lower body clothing?: A Little 6 Click Score: 19   End of Session Equipment Utilized During Treatment: Gait belt;Rolling walker (2 wheels) Nurse Communication: Mobility status  Activity Tolerance: Patient tolerated treatment well Patient left: in bed;with call bell/phone within reach;with bed alarm set;with family/visitor present  OT Visit Diagnosis: Other abnormalities of gait and mobility (R26.89);Muscle weakness (generalized) (M62.81)                Time: 1225-8346 OT Time Calculation (min): 34 min Charges:  OT General Charges $OT Visit: 1 Visit OT Evaluation $OT Eval Low Complexity: 1 Low  [facility] MS, OTR/L ascom [phone]  07/12/22, 3:50 PM

## 2022-07-12 NOTE — Progress Notes (Cosign Needed)
Patient is not able to walk the distance required to go the bathroom, or he/she is unable to safely negotiate stairs required to access the bathroom.  A 3in1 BSC will alleviate this problem  

## 2022-07-12 NOTE — Evaluation (Signed)
Physical Therapy Evaluation Patient Details Name: Margaret Avila MRN: [id number] DOB: [birth year] Today's Date: 07/12/2022  History of Present Illness  Pt admitted for L THR revision. History of 2 previous THRs at another hospital. Pt reports she was ambulating on Sunday when this hip gave out and she was unable to ambulate. Limited PMH available in chart.  Clinical Impression  Pt is a pleasant 71 year old female who was admitted for L THR revision. Pt performs bed mobility with min assist, transfers with cga, and ambulation with cga and RW. Pt educated on post hip precautions and correct WBing status. Pt demonstrates deficits with strength/pain/mobility. Would benefit from skilled PT to address above deficits and promote optimal return to PLOF. Recommend transition to [place] upon discharge from acute hospitalization.       Recommendations for follow up therapy are one component of a multi-disciplinary discharge planning process, led by the attending physician.  Recommendations may be updated based on patient status, additional functional criteria and insurance authorization.  Follow Up Recommendations Home health PT      Assistance Recommended at Discharge Intermittent Supervision/Assistance  Patient can return home with the following  A little help with walking and/or transfers;A little help with bathing/dressing/bathroom;Help with stairs or ramp for entrance    Equipment Recommendations BSC/3in1  Recommendations for Other Services       Functional Status Assessment Patient has had a recent decline in their functional status and demonstrates the ability to make significant improvements in function in a reasonable and predictable amount of time.     Precautions / Restrictions Precautions Precautions: Fall;Posterior Hip Precaution Booklet Issued: No Restrictions Weight Bearing Restrictions: Yes LLE Weight Bearing: Partial weight bearing LLE Partial Weight Bearing Percentage or  Pounds: 50      Mobility  Bed Mobility Overal bed mobility: Needs Assistance Bed Mobility: Supine to Sit     Supine to sit: Min assist     General bed mobility comments: follows commands well and able to use B UE for trunkal elevation. Needs assist for B LE progression. Once seated, needs frequent reminders about precautions.    Transfers Overall transfer level: Needs assistance Equipment used: Rolling walker (2 wheels) Transfers: Sit to/from Stand Sit to Stand: Min guard           General transfer comment: cues for sequencing    Ambulation/Gait Ambulation/Gait assistance: Min guard Gait Distance (Feet): 100 Feet Assistive device: Rolling walker (2 wheels) Gait Pattern/deviations: Step-to pattern       General Gait Details: able to adhere to post precuations with cues in addition to reminders for PWB. Safe technique with RW. No pain with exertion  Stairs            Wheelchair Mobility    Modified Rankin (Stroke Patients Only)       Balance Overall balance assessment: Needs assistance Sitting-balance support: Feet supported Sitting balance-Leahy Scale: Good     Standing balance support: Bilateral upper extremity supported Standing balance-Leahy Scale: Fair                               Pertinent Vitals/Pain Pain Assessment Pain Assessment: No/denies pain    Home Living Family/patient expects to be discharged to:: Private residence Living Arrangements: Children Available Help at Discharge: Family;Available 24 hours/day Type of Home: House Home Access: Stairs to enter Entrance Stairs-Rails: None Entrance Stairs-Number of Steps: 1 Alternate Level Stairs-Number of Steps: 15 Home Layout: Two  level;Bed/bath upstairs Home Equipment: Rolling Walker (2 wheels) Additional Comments: plans to sleep upstairs when cleared, however can sleep on main floor in chair if needed    Prior Function Prior Level of Function : Independent/Modified  Independent             Mobility Comments: has been ambulatory with RW for awhile, reports no recent falls. Not driving       Hand Dominance        Extremity/Trunk Assessment   Upper Extremity Assessment Upper Extremity Assessment: Overall WFL for tasks assessed    Lower Extremity Assessment Lower Extremity Assessment: Generalized weakness (L LE grossly 3/5; R LE grossly 4/5)       Communication   Communication: No difficulties  Cognition Arousal/Alertness: Awake/alert Behavior During Therapy: WFL for tasks assessed/performed Overall Cognitive Status: Within Functional Limits for tasks assessed                                 General Comments: slight delay processing, seems foggy        General Comments      Exercises Other Exercises Other Exercises: supine ther-ex performed on L LE including hip abd/add, QS, SLRs, and AP. 10 reps with min assist   Assessment/Plan    PT Assessment Patient needs continued PT services  PT Problem List Decreased strength;Decreased balance;Decreased mobility;Pain       PT Treatment Interventions Gait training;Therapeutic exercise;DME instruction    PT Goals (Current goals can be found in the Care Plan section)  Acute Rehab PT Goals Patient Stated Goal: to go home PT Goal Formulation: With patient Time For Goal Achievement: 07/26/22 Potential to Achieve Goals: Good    Frequency BID     Co-evaluation               AM-PAC PT "6 Clicks" Mobility  Outcome Measure Help needed turning from your back to your side while in a flat bed without using bedrails?: A Little Help needed moving from lying on your back to sitting on the side of a flat bed without using bedrails?: A Little Help needed moving to and from a bed to a chair (including a wheelchair)?: A Little Help needed standing up from a chair using your arms (e.g., wheelchair or bedside chair)?: A Little Help needed to walk in hospital room?: A  Little Help needed climbing 3-5 steps with a railing? : A Lot 6 Click Score: 17    End of Session Equipment Utilized During Treatment: Gait belt Activity Tolerance: Patient tolerated treatment well Patient left: in chair;with chair alarm set;with family/visitor present Nurse Communication: Mobility status PT Visit Diagnosis: Muscle weakness (generalized) (M62.81);Pain;Difficulty in walking, not elsewhere classified (R26.2) Pain - Right/Left: Left Pain - part of body: Hip    Time: 7017-7939 PT Time Calculation (min) (ACUTE ONLY): 28 min   Charges:   PT Evaluation $PT Eval Low Complexity: 1 Low PT Treatments $Therapeutic Exercise: 8-22 mins        Greggory Stallion, PT, DPT, GCS [phone]   Ellyssa Zagal 07/12/2022, 10:25 AM

## 2022-07-12 NOTE — Progress Notes (Signed)
Physical Therapy Treatment Patient Details Name: Margaret Avila MRN: [id number] DOB: 1951-05-24 Today's Date: 07/12/2022   History of Present Illness Pt admitted for L THR revision. History of 2 previous THRs at another hospital. Pt reports she was ambulating on Sunday when this hip gave out and she was unable to ambulate. Limited PMH available in chart.    PT Comments    Pt is making good progress towards goals with ability to ambulate in hallway with RW. Written HEP given and reviewed. Still needs cues for hip precautions. Will continue to progress as able.  Recommendations for follow up therapy are one component of a multi-disciplinary discharge planning process, led by the attending physician.  Recommendations may be updated based on patient status, additional functional criteria and insurance authorization.  Follow Up Recommendations  Home health PT     Assistance Recommended at Discharge Intermittent Supervision/Assistance  Patient can return home with the following A little help with walking and/or transfers;A little help with bathing/dressing/bathroom;Help with stairs or ramp for entrance   Equipment Recommendations  BSC/3in1    Recommendations for Other Services       Precautions / Restrictions Precautions Precautions: Fall;Posterior Hip Precaution Booklet Issued: Yes (comment) Restrictions Weight Bearing Restrictions: Yes LLE Weight Bearing: Partial weight bearing LLE Partial Weight Bearing Percentage or Pounds: 50     Mobility  Bed Mobility Overal bed mobility: Needs Assistance Bed Mobility: Supine to Sit, Sit to Supine     Supine to sit: Min assist Sit to supine: Min assist   General bed mobility comments: follows commands well with cues for assist and maintaining hip precuations.    Transfers Overall transfer level: Needs assistance Equipment used: Rolling walker (2 wheels) Transfers: Sit to/from Stand Sit to Stand: Min guard           General  transfer comment: cues for safe technique. Once standing, upright posture noted    Ambulation/Gait Ambulation/Gait assistance: Min guard Gait Distance (Feet): 150 Feet Assistive device: Rolling walker (2 wheels) Gait Pattern/deviations: Step-through pattern       General Gait Details: ambulated with reciprocal gait pattern and upright posture. Cues for WBing status. Safe technique   Stairs             Wheelchair Mobility    Modified Rankin (Stroke Patients Only)       Balance Overall balance assessment: Needs assistance Sitting-balance support: Feet supported Sitting balance-Leahy Scale: Good     Standing balance support: Bilateral upper extremity supported Standing balance-Leahy Scale: Fair                              Cognition Arousal/Alertness: Awake/alert Behavior During Therapy: WFL for tasks assessed/performed Overall Cognitive Status: Within Functional Limits for tasks assessed                                 General Comments: pleasant, appears fatigued        Exercises Other Exercises Other Exercises: supine/seated ther-ex performed on L LE including LAQ, hip abd/add, SAQ, and quad sets. 10 reps with min assist Other Exercises: Written HEP reviewed in addition to hip precautions. Able to recite 1/3 hip precautions Other Exercises: ambulated to bathroom with cga and RW. Cues for sequencing    General Comments        Pertinent Vitals/Pain Pain Assessment Pain Assessment: No/denies pain    Home  Living Family/patient expects to be discharged to:: Private residence Living Arrangements: Children Available Help at Discharge: Family;Available 24 hours/day Type of Home: House Home Access: Stairs to enter Entrance Stairs-Rails: None Entrance Stairs-Number of Steps: 1 Alternate Level Stairs-Number of Steps: 15 Home Layout: Two level;Bed/bath upstairs Home Equipment: Conservation officer, nature (2 wheels) Additional Comments: plans  to sleep upstairs when cleared, however can sleep on main floor in chair if needed    Prior Function            PT Goals (current goals can now be found in the care plan section) Acute Rehab PT Goals Patient Stated Goal: to go home PT Goal Formulation: With patient Time For Goal Achievement: 07/26/22 Potential to Achieve Goals: Good Progress towards PT goals: Progressing toward goals    Frequency    BID      PT Plan Current plan remains appropriate    Co-evaluation              AM-PAC PT "6 Clicks" Mobility   Outcome Measure  Help needed turning from your back to your side while in a flat bed without using bedrails?: A Little Help needed moving from lying on your back to sitting on the side of a flat bed without using bedrails?: A Little Help needed moving to and from a bed to a chair (including a wheelchair)?: A Little Help needed standing up from a chair using your arms (e.g., wheelchair or bedside chair)?: A Little Help needed to walk in hospital room?: A Little Help needed climbing 3-5 steps with a railing? : A Lot 6 Click Score: 17    End of Session Equipment Utilized During Treatment: Gait belt Activity Tolerance: Patient tolerated treatment well Patient left: in bed;with bed alarm set Nurse Communication: Mobility status PT Visit Diagnosis: Muscle weakness (generalized) (M62.81);Pain;Difficulty in walking, not elsewhere classified (R26.2) Pain - Right/Left: Left Pain - part of body: Hip     Time: 3154-0086 PT Time Calculation (min) (ACUTE ONLY): 24 min  Charges:  $Gait Training: 8-22 mins $Therapeutic Exercise: 8-22 mins                     Greggory Stallion, PT, DPT, GCS [phone]    Humphrey Guerreiro 07/12/2022, 3:52 PM

## 2022-07-13 DIAGNOSIS — M24852 Other specific joint derangements of left hip, not elsewhere classified: Secondary | ICD-10-CM | POA: Diagnosis not present

## 2022-07-13 DIAGNOSIS — Z96642 Presence of left artificial hip joint: Secondary | ICD-10-CM | POA: Diagnosis not present

## 2022-07-13 MED ORDER — METHOCARBAMOL 750 MG PO TABS: 750.0000 mg | ORAL_TABLET | Freq: Three times a day (TID) | ORAL | 1 refills | Status: DC | PRN

## 2022-07-13 MED ORDER — HYDROCODONE-ACETAMINOPHEN 5-325 MG PO TABS: 1.0000 | ORAL_TABLET | ORAL | 0 refills | Status: DC | PRN

## 2022-07-13 MED ORDER — DOCUSATE SODIUM 100 MG PO CAPS: 100.0000 mg | ORAL_CAPSULE | Freq: Two times a day (BID) | ORAL | 0 refills | Status: DC

## 2022-07-13 NOTE — Progress Notes (Signed)
Physical Therapy Treatment Patient Details Name: Margaret Avila MRN: [id number] DOB: [birth year] Today's Date: 07/13/2022   History of Present Illness Pt admitted for L THR revision. History of 2 previous THRs at another hospital. Pt reports she was ambulating on Sunday when this hip gave out and she was unable to ambulate. Limited PMH available in chart.    PT Comments    Pt is making gradual progress towards goals with ability to ambulate in hallway, however does fatigue and require seated rest break. HEP reviewed and performed. RW used. Able to recall 0/3 hip precautions and educated on [place] status. Will continue to progress.   Recommendations for follow up therapy are one component of a multi-disciplinary discharge planning process, led by the attending physician.  Recommendations may be updated based on patient status, additional functional criteria and insurance authorization.  Follow Up Recommendations  Home health PT     Assistance Recommended at Discharge Intermittent Supervision/Assistance  Patient can return home with the following A little help with walking and/or transfers;A little help with bathing/dressing/bathroom;Help with stairs or ramp for entrance   Equipment Recommendations  BSC/3in1    Recommendations for Other Services       Precautions / Restrictions Precautions Precautions: Fall;Posterior Hip Precaution Booklet Issued: Yes (comment) Restrictions Weight Bearing Restrictions: Yes LLE Weight Bearing: Partial weight bearing LLE Partial Weight Bearing Percentage or Pounds: 50     Mobility  Bed Mobility Overal bed mobility: Needs Assistance Bed Mobility: Supine to Sit     Supine to sit: Min assist     General bed mobility comments: follows commands. Poor recall of precautions    Transfers Overall transfer level: Needs assistance Equipment used: Rolling walker (2 wheels) Transfers: Sit to/from Stand Sit to Stand: Min guard           General  transfer comment: cues for sequencing. Once standing, upright posture and safe use of RW    Ambulation/Gait Ambulation/Gait assistance: Min guard Gait Distance (Feet): 100 Feet Assistive device: Rolling walker (2 wheels) Gait Pattern/deviations: Step-through pattern       General Gait Details: ambulated with step to gait pattern and progressed to partial step through pattern. RW used. Pt demonstrates fatigue with ambulation needing seated rest break. Wheeled back to room   Stairs             Wheelchair Mobility    Modified Rankin (Stroke Patients Only)       Balance Overall balance assessment: Needs assistance Sitting-balance support: Feet supported Sitting balance-Leahy Scale: Good     Standing balance support: Bilateral upper extremity supported Standing balance-Leahy Scale: Fair                              Cognition Arousal/Alertness: Awake/alert Behavior During Therapy: Flat affect Overall Cognitive Status: Within Functional Limits for tasks assessed                                 General Comments: slow processing, appears fatigued this date        Exercises Other Exercises Other Exercises: supine ther-ex performed on L LE including AP, quad sets, SLRs, hip abd/add, QS, and hip add squeezes. 12 reps performed. Min assist given    General Comments        Pertinent Vitals/Pain Pain Assessment Pain Assessment: Faces Faces Pain Scale: Hurts a little bit Pain Location: L  hip Pain Descriptors / Indicators: Operative site guarding Pain Intervention(s): Limited activity within patient's tolerance, Ice applied, Patient requesting pain meds-RN notified    Home Living                          Prior Function            PT Goals (current goals can now be found in the care plan section) Acute Rehab PT Goals Patient Stated Goal: to go home PT Goal Formulation: With patient Time For Goal Achievement:  07/26/22 Potential to Achieve Goals: Good Progress towards PT goals: Progressing toward goals    Frequency    BID      PT Plan Current plan remains appropriate    Co-evaluation              AM-PAC PT "6 Clicks" Mobility   Outcome Measure  Help needed turning from your back to your side while in a flat bed without using bedrails?: A Little Help needed moving from lying on your back to sitting on the side of a flat bed without using bedrails?: A Little Help needed moving to and from a bed to a chair (including a wheelchair)?: A Little Help needed standing up from a chair using your arms (e.g., wheelchair or bedside chair)?: A Little Help needed to walk in hospital room?: A Little Help needed climbing 3-5 steps with a railing? : A Lot 6 Click Score: 17    End of Session Equipment Utilized During Treatment: Gait belt Activity Tolerance: Patient tolerated treatment well Patient left: in chair;with chair alarm set Nurse Communication: Mobility status PT Visit Diagnosis: Muscle weakness (generalized) (M62.81);Pain;Difficulty in walking, not elsewhere classified (R26.2) Pain - Right/Left: Left Pain - part of body: Hip     Time: 9563-8756 PT Time Calculation (min) (ACUTE ONLY): 23 min  Charges:  $Gait Training: 8-22 mins $Therapeutic Exercise: 8-22 mins                     Greggory Stallion, PT, DPT, GCS [phone]    Jeena Arnett 07/13/2022, 12:58 PM

## 2022-07-13 NOTE — Plan of Care (Signed)

## 2022-07-13 NOTE — Progress Notes (Signed)
  Subjective:  Patient reports pain as mild to moderate.    Objective:   VITALS:   Vitals:   07/12/22 1622 07/12/22 2224 07/13/22 0016 07/13/22 0553  BP: (!) 122/51 (!) 109/48 (!) 85/47 (!) 102/50  Pulse: 86 79 81 87  Resp: 18  20   Temp: 98.2 F (36.8 C)  99.8 F (37.7 C)   TempSrc: Oral     SpO2: 97%  98% 97%  Weight:      Height:        PHYSICAL EXAM:  Neurologically intact ABD soft Neurovascular intact Sensation intact distally Intact pulses distally Dorsiflexion/Plantar flexion intact Incision: dressing C/D/I No cellulitis present Compartment soft  LABS  No results found for this or any previous visit (from the past 24 hour(s)).  DG HIP PORT UNILAT WITH PELVIS 1V LEFT  Result Date: 07/11/2022 CLINICAL DATA:  Revision of left hip arthroplasty EXAM: DG HIP (WITH OR WITHOUT PELVIS) 1V PORT LEFT COMPARISON:  10/24/2015, 03/21/2022 FINDINGS: Frontal view of the pelvis as well as a cross-table lateral view of the left hip are obtained. Interval revision of the left hip arthroplasty. There is a screwed in acetabular component, and a Press-Fit femoral component in the expected position. The small screw fragment within the left acetabular roof may be related to prior orthopedic hardware. Right hip is stable. Remainder of the bony pelvis is unremarkable. IMPRESSION: 1. Postsurgical changes from left hip arthroplasty revision. Electronically Signed   By: Randa Ngo M.D.   On: 07/11/2022 19:12    Assessment/Plan: 2 Days Post-Op   Principal Problem:   History of revision of total replacement of left hip joint   Up with therapy Discharge home today with HHPT if PT goals met   Carlynn Spry , PA-C 07/13/2022, 7:23 AM

## 2022-07-13 NOTE — Plan of Care (Signed)
  Problem: Pain Management: Goal: Pain level will decrease with appropriate interventions Outcome: Progressing   Problem: Skin Integrity: Goal: Will show signs of wound healing Outcome: Progressing   Problem: Activity: Goal: Risk for activity intolerance will decrease Outcome: Progressing   Problem: Pain Managment: Goal: General experience of comfort will improve Outcome: Progressing

## 2022-07-13 NOTE — Progress Notes (Signed)
Physical Therapy Treatment Patient Details Name: Margaret Avila MRN: [id number] DOB: Apr 30, 1951 Today's Date: 07/13/2022   History of Present Illness Pt admitted for L THR revision. History of 2 previous THRs at another hospital. Pt reports she was ambulating on Sunday when this hip gave out and she was unable to ambulate. Limited PMH available in chart.    PT Comments    Pt is making good progress towards goals. Notified of family hesitation for home discharge due to flight of stairs to access bedroom. Encouraged pt/family to problem solve sleeping arrangement on main level, however would practice stair training while admitted. Pt able to perform 8 steps this date with safe technique in addition to a long walk. Discussed with patient and daughter. Recommend to stay on main level for a few nights until able to manage whole flight of stairs. Still demonstrates difficulty with recall of hip precautions naming 2/3 correctly. Advocating for additional night's stay to have another PT session next date for pt confidence. MD in agreement. Will continue to progress.  Recommendations for follow up therapy are one component of a multi-disciplinary discharge planning process, led by the attending physician.  Recommendations may be updated based on patient status, additional functional criteria and insurance authorization.  Follow Up Recommendations  Home health PT     Assistance Recommended at Discharge Intermittent Supervision/Assistance  Patient can return home with the following A little help with walking and/or transfers;A little help with bathing/dressing/bathroom;Help with stairs or ramp for entrance   Equipment Recommendations  BSC/3in1    Recommendations for Other Services       Precautions / Restrictions Precautions Precautions: Fall;Posterior Hip Precaution Booklet Issued: Yes (comment) Restrictions Weight Bearing Restrictions: Yes LLE Weight Bearing: Partial weight bearing LLE  Partial Weight Bearing Percentage or Pounds: 50     Mobility  Bed Mobility Overal bed mobility: Needs Assistance Bed Mobility: Supine to Sit     Supine to sit: Mod assist Sit to supine: Mod assist   General bed mobility comments: needs assist for B LEs and cues for precautions    Transfers Overall transfer level: Needs assistance Equipment used: Rolling walker (2 wheels) Transfers: Sit to/from Stand Sit to Stand: Min guard           General transfer comment: limited recall of cues for hand placement. Once standing, upright posture noted    Ambulation/Gait Ambulation/Gait assistance: Min guard Gait Distance (Feet): 175 Feet Assistive device: Rolling walker (2 wheels) Gait Pattern/deviations: Step-through pattern       General Gait Details: ambulated with improved gait pattern and speed. Chair follow with 1 seated rest break. Limited by soreness this session   Stairs Stairs: Yes Stairs assistance: Min guard Stair Management: One rail Left, One rail Right, Step to pattern, Forwards Number of Stairs: 8 General stair comments: up/down 8 steps to begin simulating home environment. Safe technique with step to gait pattern. Cues for sequencing during turns   Wheelchair Mobility    Modified Rankin (Stroke Patients Only)       Balance Overall balance assessment: Needs assistance Sitting-balance support: Feet supported Sitting balance-Leahy Scale: Good     Standing balance support: Bilateral upper extremity supported Standing balance-Leahy Scale: Fair                              Cognition Arousal/Alertness: Awake/alert Behavior During Therapy: Flat affect Overall Cognitive Status: Within Functional Limits for tasks assessed  General Comments: improved alertness this date        Exercises Other Exercises Other Exercises: supine ther-ex performed on L LE including AP, quad sets, SLRs, hip abd/add,  QS, and hip add squeezes. 12 reps performed. Min assist given    General Comments        Pertinent Vitals/Pain Pain Assessment Pain Assessment: Faces Faces Pain Scale: Hurts even more Pain Location: L hip Pain Descriptors / Indicators: Operative site guarding Pain Intervention(s): Limited activity within patient's tolerance, Patient requesting pain meds-RN notified, Repositioned    Home Living                          Prior Function            PT Goals (current goals can now be found in the care plan section) Acute Rehab PT Goals Patient Stated Goal: to go home PT Goal Formulation: With patient Time For Goal Achievement: 07/26/22 Potential to Achieve Goals: Good Progress towards PT goals: Progressing toward goals    Frequency    BID      PT Plan Current plan remains appropriate    Co-evaluation              AM-PAC PT "6 Clicks" Mobility   Outcome Measure  Help needed turning from your back to your side while in a flat bed without using bedrails?: A Little Help needed moving from lying on your back to sitting on the side of a flat bed without using bedrails?: A Little Help needed moving to and from a bed to a chair (including a wheelchair)?: A Little Help needed standing up from a chair using your arms (e.g., wheelchair or bedside chair)?: A Little Help needed to walk in hospital room?: A Little Help needed climbing 3-5 steps with a railing? : A Lot 6 Click Score: 17    End of Session Equipment Utilized During Treatment: Gait belt Activity Tolerance: Patient tolerated treatment well Patient left: in bed;with bed alarm set;with SCD's reapplied Nurse Communication: Mobility status PT Visit Diagnosis: Muscle weakness (generalized) (M62.81);Pain;Difficulty in walking, not elsewhere classified (R26.2) Pain - Right/Left: Left Pain - part of body: Hip     Time: 1342-1416 PT Time Calculation (min) (ACUTE ONLY): 34 min  Charges:  $Gait Training:  8-22 mins $Therapeutic Exercise: 8-22 mins                     Greggory Stallion, PT, DPT, GCS [phone]    Margaret Avila 07/13/2022, 3:28 PM

## 2022-07-13 NOTE — Discharge Summary (Signed)
Physician Discharge Summary  Patient ID: Margaret Avila MRN: [id number] DOB/AGE: 73-Mar-1952 73 y.o.  Admit date: 07/11/2022 Discharge date: 07/13/2022  Admission Diagnoses:  M24.859 Oth specific joint derangements of unsp hip, NEC History of revision of total replacement of left hip joint  Discharge Diagnoses:  M24.859 Oth specific joint derangements of unsp hip, NEC Principal Problem:   History of revision of total replacement of left hip joint   Past Medical History:  Diagnosis Date   (HFpEF) heart failure with preserved ejection fraction ([facility])    a.) TTE 01/18/2017: EF 55-60%, mild LVH, degen MV disease, AoV sclerosis, triv TR/PR; b.) TTE 05/01/2021: EF 65-70%, mod LVH, mild AR, G1DD; c.) TTE 05/29/2022: EF 65-70%, triv-mild MR   Acid reflux    Anemia    Anxiety    Arthritis    Cervical spondylosis    Coronary artery calcification seen on CT scan    DDD (degenerative disc disease), lumbar    Depression    DOE (dyspnea on exertion)    DVT (deep venous thrombosis) ([facility])    Headache    Hiatal hernia    History of left breast cancer    a.) Tx'd with systemic chemotherapy (completed 2016) + XRT (began 10/2015) + surgical resection (mastectomy); continues on oral capecitabine --> Dr. Sherre Lain in [place], [place] (oncologist)   Hyperlipidemia    Hypertension    Illiteracy    Insomnia    Long term current use of anticoagulant    a.) rivaroxaban   Memory changes    Multiple pulmonary emboli ([facility])    Murmur    Palpitations    Port-A-Cath in place    TIA (transient ischemic attack)    Vertigo     Surgeries: Procedure(s): TOTAL HIP REVISION on 07/11/2022   Consultants (if any):   Discharged Condition: Improved  Hospital Course: Margaret Avila is an 71 y.o. female who was admitted 07/11/2022 with a diagnosis of  M24.859 Oth specific joint derangements of unsp hip, NEC History of revision of total replacement of left hip joint and went to the operating room on 07/11/2022 and  underwent the above named procedures.    She was given perioperative antibiotics:  Anti-infectives (From admission, onward)    Start     Dose/Rate Route Frequency Ordered Stop   07/11/22 1900  ceFAZolin (ANCEF) IVPB 1 g/50 mL premix        1 g 100 mL/hr over 30 Minutes Intravenous Every 6 hours 07/11/22 1805 07/12/22 0928   07/11/22 1048  ceFAZolin (ANCEF) 2-4 GM/100ML-% IVPB       Note to Pharmacy: Olena Mater F: cabinet override      07/11/22 1048 07/11/22 1325   07/11/22 0600  ceFAZolin (ANCEF) IVPB 2g/100 mL premix        2 g 200 mL/hr over 30 Minutes Intravenous On call to O.R. 07/11/22 0020 07/11/22 1334     .  She was given sequential compression devices, early ambulation, and Continue home Xarelto for DVT prophylaxis.  She benefited maximally from the hospital stay and there were no complications.    Recent vital signs:  Vitals:   07/13/22 0016 07/13/22 0553  BP: (!) 85/47 (!) 102/50  Pulse: 81 87  Resp: 20   Temp: 99.8 F (37.7 C)   SpO2: 98% 97%    Recent laboratory studies:  Lab Results  Component Value Date   HGB 12.0 05/18/2022   HGB 12.5 02/08/2022   HGB 12.4 05/01/2021   Lab Results  Component Value Date   WBC 6.6 05/18/2022   PLT 279 05/18/2022   Lab Results  Component Value Date   INR 2.6 (H) 04/30/2021   Lab Results  Component Value Date   NA 139 07/03/2022   K 3.1 (L) 07/03/2022   CL 107 07/03/2022   CO2 22 07/03/2022   BUN 13 07/03/2022   CREATININE 0.72 07/03/2022   GLUCOSE 105 (H) 07/03/2022    Discharge Medications:   Allergies as of 07/13/2022   No Known Allergies      Medication List     TAKE these medications    amLODipine 10 MG tablet Commonly known as: NORVASC Take 10 mg by mouth every morning.   capecitabine 500 MG tablet Commonly known as: XELODA Take 1,000 mg/m2 by mouth 2 (two) times daily after a meal.   carvedilol 6.25 MG tablet Commonly known as: COREG Take 6.25 mg by mouth 2 (two) times daily with  a meal.   docusate sodium 100 MG capsule Commonly known as: COLACE Take 1 capsule (100 mg total) by mouth 2 (two) times daily.   gabapentin 100 MG capsule Commonly known as: NEURONTIN Take 100 mg by mouth at bedtime.   hydrALAZINE 50 MG tablet Commonly known as: APRESOLINE Take 50 mg by mouth 3 (three) times daily.   HYDROcodone-acetaminophen 5-325 MG tablet Commonly known as: NORCO/VICODIN Take 1 tablet by mouth every 4 (four) hours as needed for moderate pain (pain score 4-6).   losartan 25 MG tablet Commonly known as: COZAAR Take 50 mg by mouth every morning.   melatonin 5 MG Tabs Take 5 mg by mouth at bedtime.   methocarbamol 750 MG tablet Commonly known as: Robaxin-750 Take 1 tablet (750 mg total) by mouth every 8 (eight) hours as needed for muscle spasms.   rivaroxaban 20 MG Tabs tablet Commonly known as: Xarelto Start after 11/23/14. '20mg'$  po daily What changed:  how much to take how to take this when to take this               Durable Medical Equipment  (From admission, onward)           Start     Ordered   07/13/22 0727  For home use only DME 3 n 1  Once        07/13/22 0726   07/13/22 0727  For home use only DME Walker rolling  Once       Question Answer Comment  Walker: With [place]   Patient needs a walker to treat with the following condition History of revision of total replacement of left hip joint      07/13/22 0726   07/12/22 1330  For home use only DME Bedside commode  Once       Question:  Patient needs a bedside commode to treat with the following condition  Answer:  Impaired mobility   07/12/22 1329            Diagnostic Studies: DG HIP PORT UNILAT WITH PELVIS 1V LEFT  Result Date: 07/11/2022 CLINICAL DATA:  Revision of left hip arthroplasty EXAM: DG HIP (WITH OR WITHOUT PELVIS) 1V PORT LEFT COMPARISON:  10/24/2015, 03/21/2022 FINDINGS: Frontal view of the pelvis as well as a cross-table lateral view of the left hip are  obtained. Interval revision of the left hip arthroplasty. There is a screwed in acetabular component, and a Press-Fit femoral component in the expected position. The small screw fragment within the left acetabular roof  may be related to prior orthopedic hardware. Right hip is stable. Remainder of the bony pelvis is unremarkable. IMPRESSION: 1. Postsurgical changes from left hip arthroplasty revision. Electronically Signed   By: Randa Ngo M.D.   On: 07/11/2022 19:12    Disposition: Discharge disposition: 01-Home or Self Care            Signed: Carlynn Spry ,PA-C 07/13/2022, 7:27 AM

## 2022-07-13 NOTE — Discharge Instructions (Signed)

## 2022-07-14 DIAGNOSIS — M24852 Other specific joint derangements of left hip, not elsewhere classified: Secondary | ICD-10-CM | POA: Diagnosis not present

## 2022-07-14 DIAGNOSIS — Z96642 Presence of left artificial hip joint: Secondary | ICD-10-CM | POA: Diagnosis not present

## 2022-07-14 NOTE — Progress Notes (Signed)
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 Pt reports she was ambulating on Sunday when this hip gave out and she was unable to ambulate. Limited PMH available in chart.    PT Comments    Patient alert, agreeable to PT up in recliner at start and session, reported 7/10 L hip pain, RN aware but pt declining pain medication. Able to only recall 1/3 posterior hip precautions at beginning and end of session. The patient was able to perform sit <> stand with RW and supervision, able to recall hand placement to maximize safety. She ambulated ~181f without a rest break, intermittently cued for PWB, no LOB. She also performed stair navigation with CGA, and needed cues throughout for safe technique (maintain precautions) and step to pattern, handout given to re-enforce education. Returned to room with all needs in reach. The patient would benefit from further skilled PT intervention to continue to progress towards goals. Recommendation remains appropriate.       Recommendations for follow up therapy are one component of a multi-disciplinary discharge planning process, led by the attending physician.  Recommendations may be updated based on patient status, additional functional criteria and insurance authorization.  Follow Up Recommendations  Home health PT     Assistance Recommended at Discharge Intermittent Supervision/Assistance  Patient can return home with the following A little help with walking and/or transfers;A little help with bathing/dressing/bathroom;Help with stairs or ramp for entrance;Assistance with cooking/housework;Assist for transportation   Equipment Recommendations  BSC/3in1    Recommendations for Other Services       Precautions / Restrictions Precautions Precautions: Fall;Posterior  Hip Precaution Booklet Issued: Yes (comment) Restrictions Weight Bearing Restrictions: Yes LLE Weight Bearing: Partial weight bearing LLE Partial Weight Bearing Percentage or Pounds: 50     Mobility  Bed Mobility               General bed mobility comments: seated om rec;oemr at start and end of session    Transfers Overall transfer level: Needs assistance Equipment used: Rolling walker (2 wheels) Transfers: Sit to/from Stand Sit to Stand: Supervision           General transfer comment: able to recall hand placement with extra time    Ambulation/Gait Ambulation/Gait assistance: Supervision Gait Distance (Feet): 175 Feet Assistive device: Rolling walker (2 wheels) Gait Pattern/deviations: Step-through pattern       General Gait Details: chair follow for safety, but not needed, intermittent cueing for PWB   Stairs Stairs: Yes Stairs assistance: Min guard Stair Management: One rail Left, One rail Right, Step to pattern, Forwards Number of Stairs: 4 General stair comments: cues for turning technique, and step to pattern, no LOB. educated on PWB status   Wheelchair Mobility    Modified Rankin (Stroke Patients Only)       Balance Overall balance assessment: Needs assistance Sitting-balance support: Feet supported Sitting balance-Leahy Scale: Good     Standing balance support: Bilateral upper extremity supported Standing balance-Leahy Scale: Fair                              Cognition Arousal/Alertness: Awake/alert Behavior During Therapy: Flat affect Overall Cognitive Status: Within Functional Limits for tasks assessed  Exercises      General Comments        Pertinent Vitals/Pain Pain Assessment Pain Assessment: 0-10 Pain Score: 7  Pain Location: L hip Pain Descriptors / Indicators: Operative site guarding Pain Intervention(s): Limited activity within patient's  tolerance, Monitored during session, Repositioned    Home Living                          Prior Function            PT Goals (current goals can now be found in the care plan section) Progress towards PT goals: Progressing toward goals    Frequency    BID      PT Plan Current plan remains appropriate    Co-evaluation              AM-PAC PT "6 Clicks" Mobility   Outcome Measure  Help needed turning from your back to your side while in a flat bed without using bedrails?: A Little Help needed moving from lying on your back to sitting on the side of a flat bed without using bedrails?: A Little Help needed moving to and from a bed to a chair (including a wheelchair)?: A Little Help needed standing up from a chair using your arms (e.g., wheelchair or bedside chair)?: A Little Help needed to walk in hospital room?: A Little Help needed climbing 3-5 steps with a railing? : A Little 6 Click Score: 18    End of Session Equipment Utilized During Treatment: Gait belt Activity Tolerance: Patient tolerated treatment well Patient left: in chair;with chair alarm set;with call bell/phone within reach Nurse Communication: Mobility status PT Visit Diagnosis: Muscle weakness (generalized) (M62.81);Pain;Difficulty in walking, not elsewhere classified (R26.2) Pain - Right/Left: Left Pain - part of body: Hip     Time: 0388-8280 PT Time Calculation (min) (ACUTE ONLY): 23 min  Charges:  $Gait Training: 8-22 mins $Therapeutic Activity: 8-22 mins                     Lieutenant Diego PT, DPT 9:52 AM,07/14/22

## 2022-07-14 NOTE — Progress Notes (Signed)
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 Pt reports she was ambulating on Sunday when this hip gave out and she was unable to ambulate. Limited PMH available in chart.   OT comments  Chart reviewed, pt greeted in chair agreeable to OT tx session. Pt continues to require verbal cues throughout for adherence to precautions however was able to recall [place] precautions on this date. ADL amb and toilet transfer completed with supervision to bsc over toilet with RW. Grooming tasks standing at sink completed with supervision. Pt reports her daughter will stay with her after discharge to assist. Pt is left in bedside chair, all needs met. OT will continue to follow acutely.    Recommendations for follow up therapy are one component of a multi-disciplinary discharge planning process, led by the attending physician.  Recommendations may be updated based on patient status, additional functional criteria and insurance authorization.    Follow Up Recommendations  Home health OT    Assistance Recommended at Discharge Intermittent Supervision/Assistance  Patient can return home with the following  A little help with walking and/or transfers;A little help with bathing/dressing/bathroom;Help with stairs or ramp for entrance;Assistance with cooking/housework;Assist for transportation   Equipment Recommendations  BSC/3in1;Tub/shower bench;Other (comment) (hip kit)    Recommendations for Other Services      Precautions / Restrictions Precautions Precautions: Fall;Posterior Hip Precaution Booklet Issued: Yes (comment) Restrictions Weight Bearing Restrictions: Yes LLE Weight Bearing: Partial weight bearing LLE Partial Weight Bearing Percentage or Pounds: 50       Mobility Bed Mobility                General bed mobility comments: NT in recliner pre/post session    Transfers Overall transfer level: Needs assistance Equipment used: Rolling walker (2 wheels) Transfers: Sit to/from Stand Sit to Stand: Supervision                 Balance Overall balance assessment: Needs assistance Sitting-balance support: Feet supported Sitting balance-Leahy Scale: Good     Standing balance support: Bilateral upper extremity supported Standing balance-Leahy Scale: Fair                             ADL either performed or assessed with clinical judgement   ADL Overall ADL's : Needs assistance/impaired Eating/Feeding: Set up;Sitting   Grooming: Wash/dry hands;Standing;Supervision/safety Grooming Details (indicate cue type and reason): sink level                 Toilet Transfer: Supervision/safety;BSC/3in1;Ambulation;Cueing for safety;Cueing for sequencing Toilet Transfer Details (indicate cue type and reason): bsc over toilet Toileting- Clothing Manipulation and Hygiene: Supervision/safety;Sit to/from stand       Functional mobility during ADLs: Supervision/safety;Rolling walker (2 wheels);Cueing for sequencing      Extremity/Trunk Assessment              Vision       Perception     Praxis      Cognition Arousal/Alertness: Awake/alert Behavior During Therapy: Flat affect Overall Cognitive Status: Within Functional Limits for tasks assessed                                 General Comments: does require continued verbal and tactile cues  for adherence to precautions        Exercises      Shoulder Instructions       General Comments vital signs appear stablet hroughout    Pertinent Vitals/ Pain       Pain Assessment Pain Assessment: 0-10 Pain Score: 7  Pain Location: L hip Pain Descriptors / Indicators: Sore Pain Intervention(s): Limited activity within patient's tolerance, Monitored during session, Repositioned  Home  Living                                          Prior Functioning/Environment              Frequency  Min 2X/week        Progress Toward Goals  OT Goals(current goals can now be found in the care plan section)  Progress towards OT goals: Progressing toward goals     Plan Discharge plan remains appropriate    Co-evaluation                 AM-PAC OT "6 Clicks" Daily Activity     Outcome Measure   Help from another person eating meals?: None Help from another person taking care of personal grooming?: None Help from another person toileting, which includes using toliet, bedpan, or urinal?: None Help from another person bathing (including washing, rinsing, drying)?: A Little Help from another person to put on and taking off regular upper body clothing?: None Help from another person to put on and taking off regular lower body clothing?: A Little 6 Click Score: 22    End of Session Equipment Utilized During Treatment: Rolling walker (2 wheels)  OT Visit Diagnosis: Other abnormalities of gait and mobility (R26.89);Muscle weakness (generalized) (M62.81)   Activity Tolerance Patient tolerated treatment well   Patient Left in chair;with call bell/phone within reach;with chair alarm set   Nurse Communication          Time: 1657-9038 OT Time Calculation (min): 9 min  Charges: OT General Charges $OT Visit: 1 Visit OT Treatments $Self Care/Home Management : 8-22 mins  Shanon Payor, OTD OTR/L  07/14/22, 10:25 AM

## 2022-07-14 NOTE — Care Management Important Message (Signed)
Important Message  Patient Details  Name: Margaret Avila MRN: [id number] Date of Birth: 12-30-1950   Medicare Important Message Given:  Yes     Dannette Barbara 07/14/2022, 11:20 AM

## 2022-07-14 NOTE — TOC Progression Note (Signed)
Transition of Care [facility]) - Progression Note    Patient Details  Name: Margaret Avila MRN: [id number] Date of Birth: 22-Jun-1951  Transition of Care [facility]) CM/SW [place], RN Phone Number: 07/14/2022, 11:38 AM  Clinical Narrative:    Daughter Janett Billow called and requested that the patient get a Hospital bed, I explained That If Insurance does not cover then there would be a cost, she stated understanding I sent the orders to [employer] and asked them to call Janett Billow with the cost and to set up delivery  [phone]   Barriers to Discharge: Barriers Resolved  Expected Discharge Plan and Services Expected Discharge Plan: [place]   Discharge Planning Services: CM Consult   Living arrangements for the past 2 months: Single Family Home Expected Discharge Date: 07/13/22                 DME Agency: [employer] Date DME Agency Contacted: 07/12/22 Time DME Agency Contacted: [id number] Representative spoke with at DME Agency: [place]: PT [place]: [place] Date [place]: 07/12/22 Time [place]: [id number] Representative spoke with at [place]: [place] Determinants of Health ([place]) Interventions    Readmission Risk Interventions     No data to display

## 2022-07-14 NOTE — Progress Notes (Cosign Needed)
Patient has hip surgery which requires lower body to be positioned in ways not feasible with a normal bed. Pain requires frequent changes in body position which cannot be achieved with a normal bed.

## 2022-12-22 IMAGING — CT NM BONE W/ SPECT
1 series · 12 of 14 positions shown, 15 images · non-contrast
Comparison: None Available.

CLINICAL DATA: LEFT hip replacement with LEFT hip pain

EXAM:
NM BONE SCAN AND SPECT IMAGING
TECHNIQUE: After intravenous injection of radiopharmaceutical, delayed planar
images were obtained in multiple projections. Additionally, delayed
triplanar SPECT images were obtained through the area of interest.
RADIOPHARMACEUTICALS:  22.2 mCi 4c-HHm MDP

[Series 2: ac bone 5.0 b08s · axial · 0.98mm/px · z∈[+1196,+1520]mm · 12 of 77 slices shown, 15 images]
[im 6/77  soft-tissue]
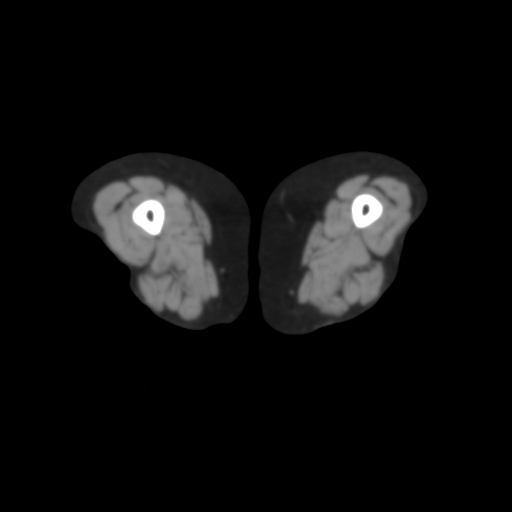
[im 6/77  bone]
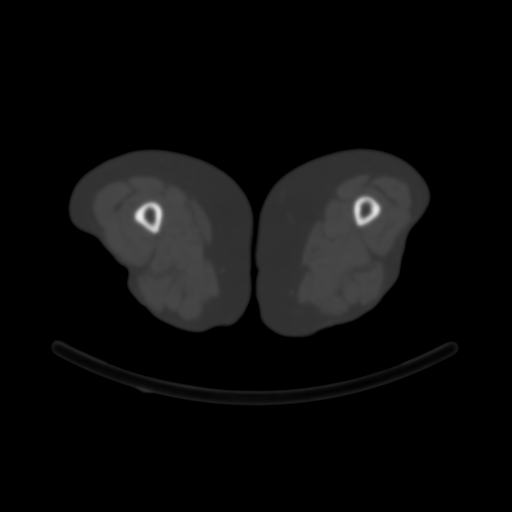
[im 12/77  bone]
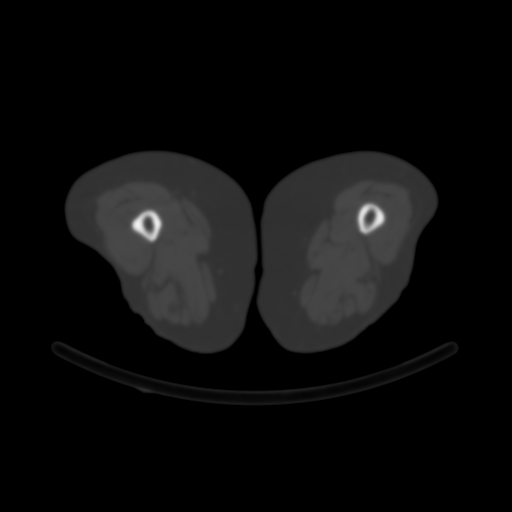
[im 18/77  bone]
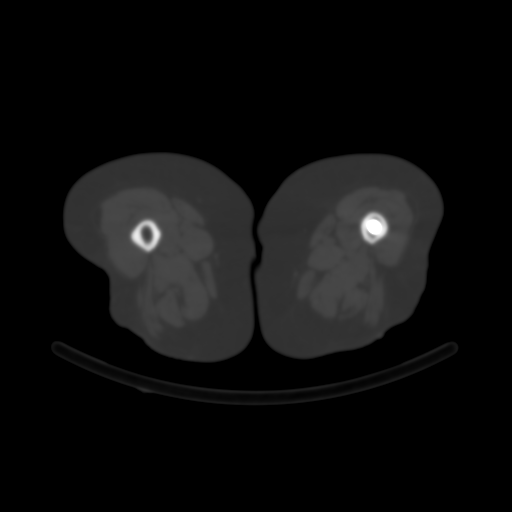
[im 24/77  bone]
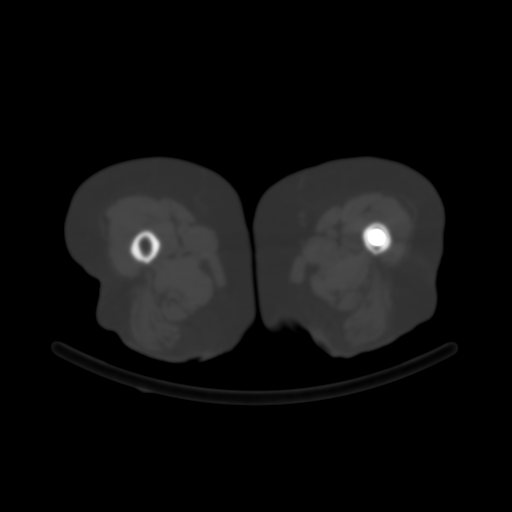
[im 30/77  soft-tissue]
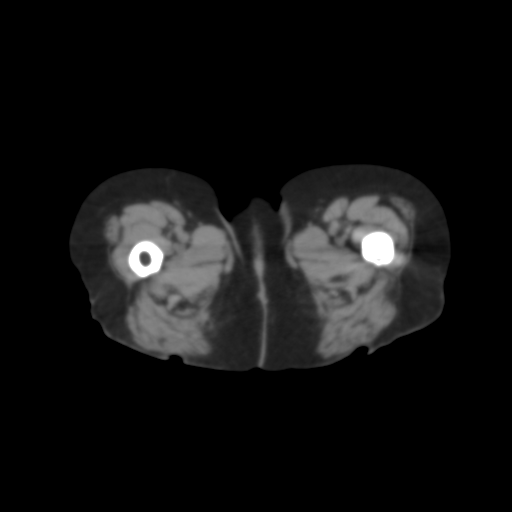
[im 30/77  bone]
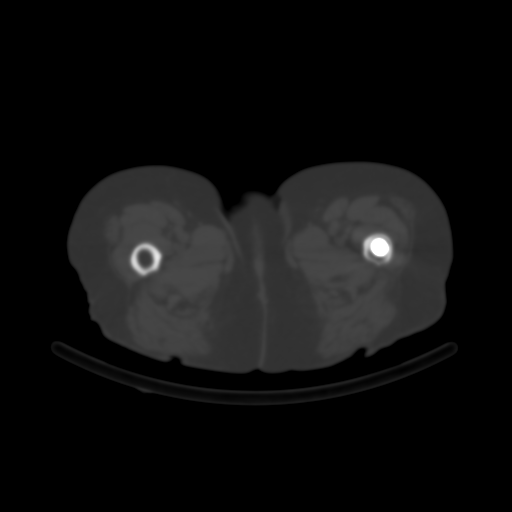
[im 36/77  bone]
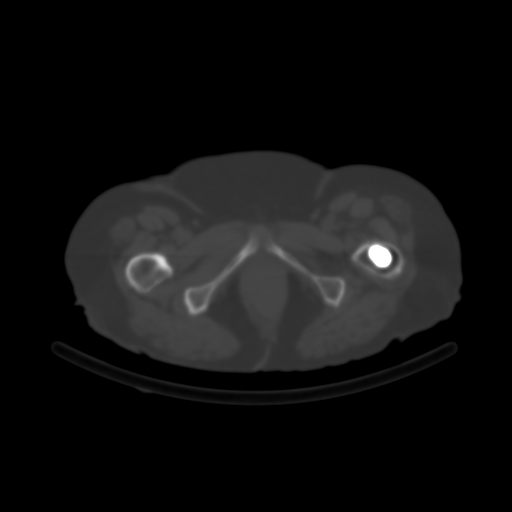
[im 41/77  bone]
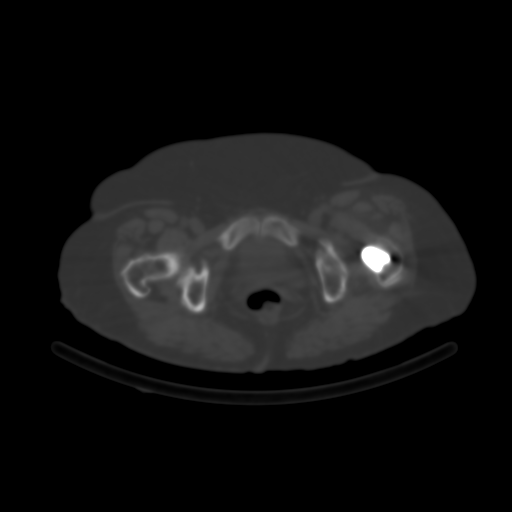
[im 47/77  bone]
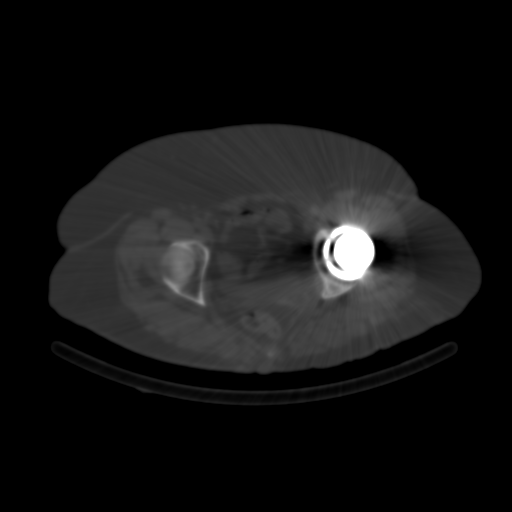
[im 53/77  soft-tissue]
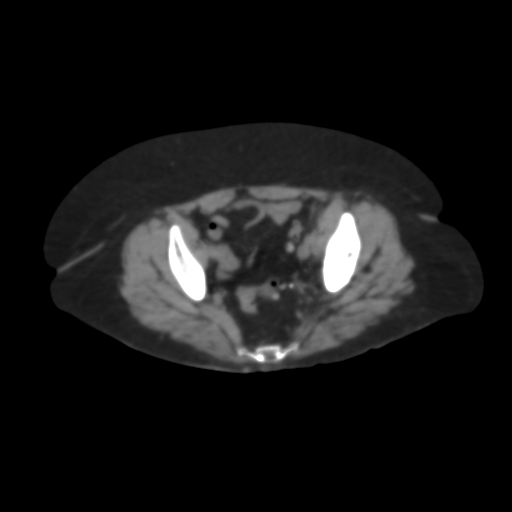
[im 53/77  bone]
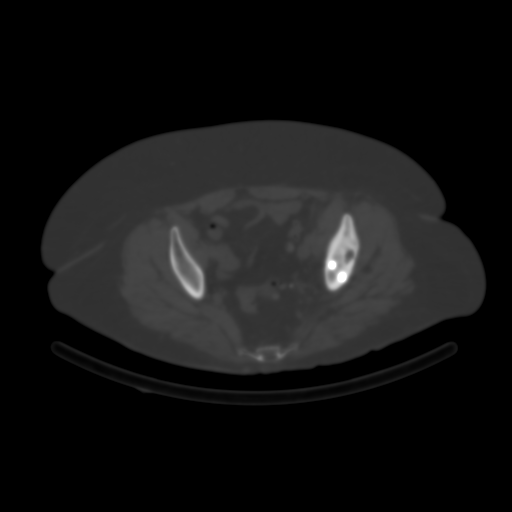
[im 59/77  bone]
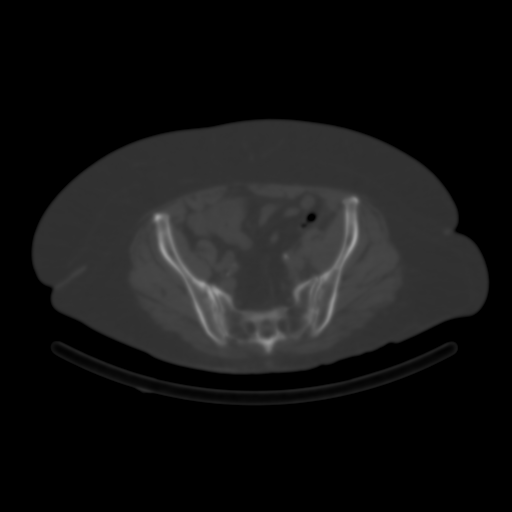
[im 65/77  bone]
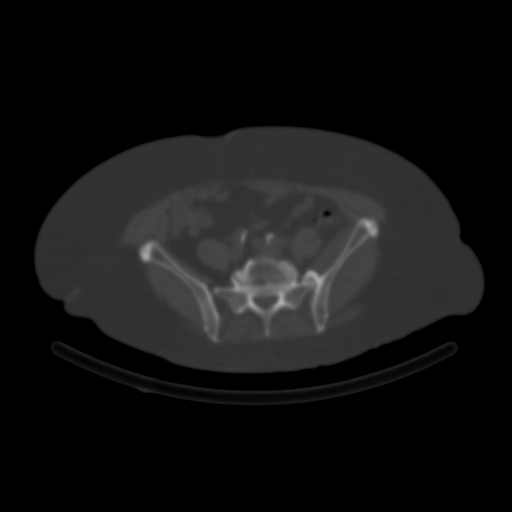
[im 71/77  bone]
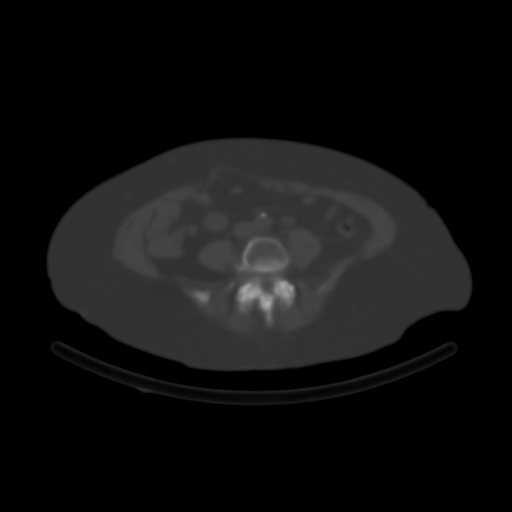

[12 of 14 positions shown; findings below may reference images not displayed]

FINDINGS: Vascular: No asymmetric or increased blood flow to the LEFT or RIGHT

Blood pool: Mild increased blood pool activity in the acetabulum on
the LEFT.

Delayed: Intense activity within the superior aspect of the LEFT
acetabulum. On CT portion exam, there is periprosthetic lucency in
the superior LEFT acetabulum (image 359 series 2551).
IMPRESSION: Concern for prosthetic loosening in the superior LEFT acetabulum.

## 2023-06-14 ENCOUNTER — Emergency Department (HOSPITAL_COMMUNITY)
Admission: EM | Admit: 2023-06-14 | Discharge: 2023-06-14 | Disposition: A | Discharge status: Home / Self Care | Payer: Medicare Other | Attending: Emergency Medicine | Admitting: Emergency Medicine

## 2023-06-14 ENCOUNTER — Other Ambulatory Visit: Payer: Self-pay

## 2023-06-14 ENCOUNTER — Emergency Department (HOSPITAL_COMMUNITY): Payer: Medicare Other

## 2023-06-14 ENCOUNTER — Encounter (HOSPITAL_COMMUNITY): Payer: Self-pay | Admitting: Emergency Medicine

## 2023-06-14 DIAGNOSIS — Z7901 Long term (current) use of anticoagulants: Secondary | ICD-10-CM | POA: Insufficient documentation

## 2023-06-14 DIAGNOSIS — I10 Essential (primary) hypertension: Secondary | ICD-10-CM | POA: Insufficient documentation

## 2023-06-14 DIAGNOSIS — Z79899 Other long term (current) drug therapy: Secondary | ICD-10-CM | POA: Insufficient documentation

## 2023-06-14 DIAGNOSIS — G459 Transient cerebral ischemic attack, unspecified: Secondary | ICD-10-CM | POA: Diagnosis not present

## 2023-06-14 DIAGNOSIS — R29810 Facial weakness: Secondary | ICD-10-CM | POA: Diagnosis present

## 2023-06-14 LAB — RAPID URINE DRUG SCREEN, HOSP PERFORMED
Amphetamines: NOT DETECTED
Barbiturates: NOT DETECTED
Benzodiazepines: NOT DETECTED
Cocaine: NOT DETECTED
Opiates: POSITIVE — AB
Tetrahydrocannabinol: NOT DETECTED

## 2023-06-14 LAB — COMPREHENSIVE METABOLIC PANEL
ALT: 13 U/L (ref 0–44)
AST: 19 U/L (ref 15–41)
Albumin: 4.1 g/dL (ref 3.5–5.0)
Alkaline Phosphatase: 118 U/L (ref 38–126)
Anion gap: 11 (ref 5–15)
BUN: 15 mg/dL (ref 8–23)
CO2: 26 mmol/L (ref 22–32)
Calcium: 9.2 mg/dL (ref 8.9–10.3)
Chloride: 99 mmol/L (ref 98–111)
Creatinine, Ser: 0.79 mg/dL (ref 0.44–1.00)
GFR, Estimated: >60 mL/min (ref >60)
Glucose, Bld: 101 mg/dL — ABNORMAL HIGH (ref 70–99)
Potassium: 3.6 mmol/L (ref 3.5–5.1)
Sodium: 136 mmol/L (ref 135–145)
Total Bilirubin: 0.4 mg/dL (ref 0.3–1.2)
Total Protein: 8.7 g/dL — ABNORMAL HIGH (ref 6.5–8.1)

## 2023-06-14 LAB — URINALYSIS, ROUTINE W REFLEX MICROSCOPIC
Bilirubin Urine: NEGATIVE
Glucose, UA: NEGATIVE mg/dL
Hgb urine dipstick: NEGATIVE
Ketones, ur: NEGATIVE mg/dL
Nitrite: NEGATIVE
Protein, ur: NEGATIVE mg/dL
Specific Gravity, Urine: 1.011 (ref 1.005–1.030)
pH: 8 (ref 5.0–8.0)

## 2023-06-14 LAB — CBC
HCT: 41.9 % (ref 36.0–46.0)
Hemoglobin: 12.8 g/dL (ref 12.0–15.0)
MCH: 27.1 pg (ref 26.0–34.0)
MCHC: 30.5 g/dL (ref 30.0–36.0)
MCV: 88.6 fL (ref 80.0–100.0)
Platelets: 279 10*3/uL (ref 150–400)
RBC: 4.73 MIL/uL (ref 3.87–5.11)
RDW: 14.9 % (ref 11.5–15.5)
WBC: 8.9 10*3/uL (ref 4.0–10.5)
nRBC: 0 % (ref 0.0–0.2)

## 2023-06-14 LAB — APTT: aPTT: 36 s (ref 24–36)

## 2023-06-14 LAB — PROTIME-INR
INR: 2.6 — ABNORMAL HIGH (ref 0.8–1.2)
Prothrombin Time: 27.8 s — ABNORMAL HIGH (ref 11.4–15.2)

## 2023-06-14 LAB — DIFFERENTIAL
Abs Immature Granulocytes: 0.02 10*3/uL (ref 0.00–0.07)
Basophils Absolute: 0 10*3/uL (ref 0.0–0.1)
Basophils Relative: 0 %
Eosinophils Absolute: 0.1 10*3/uL (ref 0.0–0.5)
Eosinophils Relative: 2 %
Immature Granulocytes: 0 %
Lymphocytes Relative: 14 %
Lymphs Abs: 1.2 10*3/uL (ref 0.7–4.0)
Monocytes Absolute: 0.8 10*3/uL (ref 0.1–1.0)
Monocytes Relative: 9 %
Neutro Abs: 6.7 10*3/uL (ref 1.7–7.7)
Neutrophils Relative %: 75 %

## 2023-06-14 LAB — ETHANOL: Alcohol, Ethyl (B): <10 mg/dL (ref <10)

## 2023-06-14 MED ORDER — IOHEXOL 350 MG/ML SOLN
75.0000 mL | Freq: Once | INTRAVENOUS | Status: AC | PRN
  Administered 2023-06-14: 75 mL via INTRAVENOUS

## 2023-06-14 NOTE — ED Notes (Signed)
Discharge instructions provided by edp were discussed with pt. Pt verbalized understanding with no additional questions at this time. Pt ambulatory at discharge. Taking home medications and cane from bedside. Pt has personal cell phone. Sts daughter in law on her way to pick her up. Pt wheelchair to lobby.

## 2023-06-14 NOTE — ED Triage Notes (Signed)
Daughter called with complaints of possible stroke. Family reported right arm contraction, left facial numbness and droop. Pt reports she has had a major tooth problem on the left side that has not been addressed. Pt reports all symptoms have resolved. Pt is alert and oriented. Pt is fully ambulatory at [place]

## 2023-06-14 NOTE — ED Provider Notes (Signed)
Care of patient received from prior provider at 9:46 PM, please see their note for complete H/P and care plan.  Received handoff per ED course.  Clinical Course as of 06/14/23 2146  Thu Jun 14, 2023  1521 Stable HO from [facility] Facial droop and arm weakness. Resolved by time of EMS arrival. BTB.  CVA workup planned. [CC]    Clinical Course User Index [CC] Glyn Ade, MD    Reassessment: Evaluated at bedside after 7 hours of observation in the emergency room.  All of her studies have resulted without acute pathology.  Consulted neurology Dr. Uvaldo Rising based on the presentation today.  He does not feel that of a repeat admission would benefit the patient as she is already on maximal medical therapy. Given extended observation period without any further symptoms, resolution of symptoms and overall well appearance as well as aggressive outpatient care management, patient stable for outpatient care and management with follow-up with neurology.  Disposition:  I have considered need for hospitalization, however, considering all of the above, I believe this patient is stable for discharge at this time.  Patient/family educated about specific return precautions for given chief complaint and symptoms.  Patient/family educated about follow-up with PCP and neurology.     Patient/family expressed understanding of return precautions and need for follow-up. Patient spoken to regarding all imaging and laboratory results and appropriate follow up for these results. All education provided in verbal form with additional information in written form. Time was allowed for answering of patient questions. Patient discharged.    Emergency Department Medication Summary:   Medications  iohexol (OMNIPAQUE) 350 MG/ML injection 75 mL (75 mLs Intravenous Contrast Given 06/14/23 1754)            Glyn Ade, MD 06/14/23 2146

## 2023-06-14 NOTE — ED Provider Notes (Signed)
[place] EMERGENCY DEPARTMENT AT [facility] Provider Note   CSN: [id number] Arrival date & time: 06/14/23  1401     History  Chief Complaint  Patient presents with   Transient Ischemic Attack    Margaret Avila is a 72 y.o. female.  HPI   72 year old female presenting to the hospital with a complaint of facial droop, arm weakness and difficulty speaking.  The patient cannot tell me which side of her face was drooping or which arm was weak but she definitely recalls that within the last hour while she was sitting on the front porch this came on her acutely.  During this time she was unable to speak and her speech was garbled.  By the time the paramedics arrived they noticed that she was back to her normal self and did not have any focal neurologic findings, her vital signs reflected some hypertension but no tachycardia fever or hypoxia.  She has no complaints of pain except for a headache, no nausea or vomiting, no diarrhea, no coughing or shortness of breath, no chest pain.  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 Authorizing Provider  amLODipine (NORVASC) 10 MG tablet Take 10 mg by mouth every morning.    [provider]  capecitabine (XELODA) 500 MG tablet Take 1,000 mg/m2 by mouth 2 (two) times daily after a meal.    [provider]  carvedilol (COREG) 6.25 MG tablet Take 6.25 mg by mouth 2 (two) times daily with a meal.    [provider]  docusate sodium (COLACE) 100 MG capsule Take 1 capsule (100 mg total) by mouth 2 (two) times daily. 07/13/22   Altamese Cabal, PA-C  gabapentin (NEURONTIN) 100 MG capsule Take 100 mg by mouth at bedtime.    [provider]  hydrALAZINE (APRESOLINE) 50 MG tablet Take 50 mg by mouth 3 (three) times daily.    [provider]  HYDROcodone-acetaminophen (NORCO/VICODIN) 5-325 MG  tablet Take 1 tablet by mouth every 4 (four) hours as needed for moderate pain (pain score 4-6). 07/13/22   Altamese Cabal, PA-C  losartan (COZAAR) 25 MG tablet Take 50 mg by mouth every morning. 04/27/21   [provider]  melatonin 5 MG TABS Take 5 mg by mouth at bedtime.    [provider]  methocarbamol (ROBAXIN-750) 750 MG tablet Take 1 tablet (750 mg total) by mouth every 8 (eight) hours as needed for muscle spasms. 07/13/22   Altamese Cabal, PA-C  rivaroxaban (XARELTO) 20 MG TABS tablet Start after 11/23/14. 20mg  po daily Patient taking differently: Take 20 mg by mouth every morning. Start after 11/23/14. 20mg  po daily 10/25/15   Hollice Espy, MD      Allergies    Patient has no known allergies.    Review of Systems   Review of Systems  All other systems reviewed and are negative.   Physical Exam Updated Vital Signs Ht 1.6 m (5\' 3" )   Wt 69 kg   BMI 26.95 kg/m  Physical Exam Vitals and nursing note reviewed.  Constitutional:      General: She is not in acute distress.    Appearance: She is well-developed.  HENT:     Head: Normocephalic and atraumatic.     Mouth/Throat:     Pharynx: No oropharyngeal exudate.  Eyes:     General: No scleral icterus.  Right eye: No discharge.        Left eye: No discharge.     Conjunctiva/sclera: Conjunctivae normal.     Pupils: Pupils are equal, round, and reactive to light.  Neck:     Thyroid: No thyromegaly.     Vascular: No JVD.  Cardiovascular:     Rate and Rhythm: Normal rate and regular rhythm.     Heart sounds: Normal heart sounds. No murmur heard.    No friction rub. No gallop.  Pulmonary:     Effort: Pulmonary effort is normal. No respiratory distress.     Breath sounds: Normal breath sounds. No wheezing or rales.  Abdominal:     General: Bowel sounds are normal. There is no distension.     Palpations: Abdomen is soft. There is no mass.     Tenderness: There is no abdominal tenderness.   Musculoskeletal:        General: No tenderness. Normal range of motion.     Cervical back: Normal range of motion and neck supple.  Lymphadenopathy:     Cervical: No cervical adenopathy.  Skin:    General: Skin is warm and dry.     Findings: No erythema or rash.  Neurological:     Mental Status: She is alert.     Coordination: Coordination normal.     Comments: Speech is clear, cranial nerves III through XII are intact, memory is intact, strength is normal in all 4 extremities including grips, sensation is intact to light touch and pinprick in all 4 extremities. Coordination as tested by finger-nose-finger is normal, no limb ataxia. Normal gait, normal reflexes at the patellar tendons bilaterally  Psychiatric:        Behavior: Behavior normal.     ED Results / Procedures / Treatments   Labs (all labs ordered are listed, but only abnormal results are displayed) Labs Reviewed  ETHANOL  PROTIME-INR  APTT  CBC  DIFFERENTIAL  COMPREHENSIVE METABOLIC PANEL  RAPID URINE DRUG SCREEN, HOSP PERFORMED  URINALYSIS, ROUTINE W REFLEX MICROSCOPIC  I-STAT CHEM 8, ED    EKG EKG Interpretation Date/Time:  Thursday June 14 2023 14:17:56 EDT Ventricular Rate:  91 PR Interval:  143 QRS Duration:  99 QT Interval:  364 QTC Calculation: 448 R Axis:   44  Text Interpretation: Sinus rhythm Probable left ventricular hypertrophy since last tracing no significant change Confirmed by Eber Hong ([id number]) on 06/14/2023 2:21:05 PM  Radiology No results found.  Procedures Procedures    Medications Ordered in ED Medications - No data to display  ED Course/ Medical Decision Making/ A&P Clinical Course as of 06/17/23 1241  Thu Jun 14, 2023  1521 Stable HO from BM Facial droop and arm weakness. Resolved by time of EMS arrival. BTB.  CVA workup planned. [CC]    Clinical Course User Index [CC] Glyn Ade, MD                                 Medical Decision Making Amount  and/or Complexity of Data Reviewed Labs: ordered. Radiology: ordered.  Risk Prescription drug management.    This patient presents to the ED for concern of focal neurologic abnormalities, this involves an extensive number of treatment options, and is a complaint that carries with it a high risk of complications and morbidity.  The differential diagnosis includes stroke, TIA, electrolyte abnormalities, infection, tumor, mass, seizure   Co morbidities that complicate the patient  evaluation  Multiple prior medical problems including pulmonary embolism, hypertension, prior TIA or stroke   Additional history obtained:  Additional history obtained from electronic medical record External records from outside source obtained and reviewed including prior admission to the hospital in July 2022, noted to have prior pulmonary embolism and hypertension, had some difficulty speaking with headache and dizziness, had a workup at that time for suspected TIA MRI of the brain in July 2022 showed no acute infarct   Lab Tests:  I Ordered, and personally interpreted labs.  The pertinent results include: No active acute findings   Imaging Studies ordered:  I ordered imaging studies including CT scan of the brain I independently visualized and interpreted imaging which showed no acute hemorrhage I agree with the radiologist interpretation   Cardiac Monitoring: / EKG:  The patient was maintained on a cardiac monitor.  I personally viewed and interpreted the cardiac monitored which showed an underlying rhythm of: Sinus rhythm   Consultations Obtained:  I requested consultation with the hospitalist,  and discussed lab and imaging findings as well as pertinent plan - they recommend: Admission for TIA workup   Problem List / ED Course / Critical interventions / Medication management  Patient remained stable, mildly hypertensive, no recurrent neurologic symptoms in the ER I have reviewed the  patients home medicines and have made adjustments as needed   Social Determinants of Health:  none   Test / Admission - Considered:  admit         Final Clinical Impression(s) / ED Diagnoses Final diagnoses:  TIA (transient ischemic attack)    Rx / DC Orders ED Discharge Orders     None         Eber Hong, MD 06/17/23 1241

## 2023-06-15 LAB — I-STAT CHEM 8, ED
BUN: 15 mg/dL (ref 8–23)
Calcium, Ion: 1.16 mmol/L (ref 1.15–1.40)
Chloride: 105 mmol/L (ref 98–111)
Creatinine, Ser: 0.8 mg/dL (ref 0.44–1.00)
Glucose, Bld: 102 mg/dL — ABNORMAL HIGH (ref 70–99)
HCT: 44 % (ref 36.0–46.0)
Hemoglobin: 15 g/dL (ref 12.0–15.0)
Potassium: 3.8 mmol/L (ref 3.5–5.1)
Sodium: 139 mmol/L (ref 135–145)
TCO2: 24 mmol/L (ref 22–32)

## 2023-10-26 ENCOUNTER — Emergency Department (HOSPITAL_BASED_OUTPATIENT_CLINIC_OR_DEPARTMENT_OTHER)
Admission: EM | Admit: 2023-10-26 | Discharge: 2023-10-27 | Disposition: A | Discharge status: Home / Self Care | Payer: Medicare Other | Attending: Emergency Medicine | Admitting: Emergency Medicine

## 2023-10-26 ENCOUNTER — Encounter (HOSPITAL_COMMUNITY): Payer: Self-pay

## 2023-10-26 ENCOUNTER — Emergency Department (HOSPITAL_COMMUNITY)
Admission: EM | Admit: 2023-10-26 | Discharge: 2023-10-26 | Disposition: A | Discharge status: Home / Self Care | Payer: Medicare Other | Attending: Emergency Medicine | Admitting: Emergency Medicine

## 2023-10-26 ENCOUNTER — Other Ambulatory Visit: Payer: Self-pay

## 2023-10-26 ENCOUNTER — Encounter (HOSPITAL_BASED_OUTPATIENT_CLINIC_OR_DEPARTMENT_OTHER): Payer: Self-pay

## 2023-10-26 DIAGNOSIS — R040 Epistaxis: Secondary | ICD-10-CM | POA: Diagnosis not present

## 2023-10-26 DIAGNOSIS — R519 Headache, unspecified: Secondary | ICD-10-CM | POA: Diagnosis present

## 2023-10-26 DIAGNOSIS — Z79899 Other long term (current) drug therapy: Secondary | ICD-10-CM | POA: Insufficient documentation

## 2023-10-26 DIAGNOSIS — Z1152 Encounter for screening for COVID-19: Secondary | ICD-10-CM | POA: Insufficient documentation

## 2023-10-26 DIAGNOSIS — E785 Hyperlipidemia, unspecified: Secondary | ICD-10-CM | POA: Diagnosis not present

## 2023-10-26 DIAGNOSIS — Z5321 Procedure and treatment not carried out due to patient leaving prior to being seen by health care provider: Secondary | ICD-10-CM | POA: Insufficient documentation

## 2023-10-26 DIAGNOSIS — I10 Essential (primary) hypertension: Secondary | ICD-10-CM | POA: Insufficient documentation

## 2023-10-26 DIAGNOSIS — I160 Hypertensive urgency: Secondary | ICD-10-CM | POA: Insufficient documentation

## 2023-10-26 DIAGNOSIS — Z7901 Long term (current) use of anticoagulants: Secondary | ICD-10-CM | POA: Insufficient documentation

## 2023-10-26 DIAGNOSIS — Z86711 Personal history of pulmonary embolism: Secondary | ICD-10-CM | POA: Insufficient documentation

## 2023-10-26 DIAGNOSIS — J019 Acute sinusitis, unspecified: Secondary | ICD-10-CM

## 2023-10-26 DIAGNOSIS — Z853 Personal history of malignant neoplasm of breast: Secondary | ICD-10-CM | POA: Insufficient documentation

## 2023-10-26 LAB — BASIC METABOLIC PANEL
Anion gap: 10 (ref 5–15)
BUN: 12 mg/dL (ref 8–23)
CO2: 25 mmol/L (ref 22–32)
Calcium: 9.5 mg/dL (ref 8.9–10.3)
Chloride: 103 mmol/L (ref 98–111)
Creatinine, Ser: 0.52 mg/dL (ref 0.44–1.00)
GFR, Estimated: >60 mL/min (ref >60)
Glucose, Bld: 108 mg/dL — ABNORMAL HIGH (ref 70–99)
Potassium: 3.4 mmol/L — ABNORMAL LOW (ref 3.5–5.1)
Sodium: 138 mmol/L (ref 135–145)

## 2023-10-26 LAB — CBC
HCT: 39.9 % (ref 36.0–46.0)
Hemoglobin: 12.8 g/dL (ref 12.0–15.0)
MCH: 27 pg (ref 26.0–34.0)
MCHC: 32.1 g/dL (ref 30.0–36.0)
MCV: 84.2 fL (ref 80.0–100.0)
Platelets: 234 10*3/uL (ref 150–400)
RBC: 4.74 MIL/uL (ref 3.87–5.11)
RDW: 14.7 % (ref 11.5–15.5)
WBC: 9.2 10*3/uL (ref 4.0–10.5)
nRBC: 0 % (ref 0.0–0.2)

## 2023-10-26 MED ORDER — CLONIDINE HCL 0.1 MG PO TABS
0.2000 mg | ORAL_TABLET | Freq: Once | ORAL | Status: AC
  Administered 2023-10-27: 0.2 mg via ORAL
  Filled 2023-10-26: qty 2

## 2023-10-26 NOTE — ED Provider Notes (Signed)
[place] EMERGENCY DEPARTMENT AT [facility] Provider Note   CSN: [id number] Arrival date & time: 10/26/23  2227     History  Chief Complaint  Patient presents with   Hypertension    Margaret Avila is a 73 y.o. female.  Patient is a 73 year old female with past medical history of hypertension, breast cancer, pulmonary embolism on Xarelto, hyperlipidemia.  Patient presenting today for evaluation of elevated blood pressure.  For the past several days, she has been experiencing nasal congestion, scratchy throat, and cough.  She also reports occasional blood when she blows her nose.  No fevers or chills.  No ill contacts.  She checked her blood pressure at home and it was over 200.  She initially presented to [facility], however left due to extended wait times and came here.  The history is provided by the patient.       Home Medications Prior to Admission medications   Medication Sig Start Date End Date Taking? Authorizing Provider  acetaminophen-codeine (TYLENOL #3) 300-30 MG tablet Take 1 tablet by mouth every 6 (six) hours as needed. 06/07/23   [provider]  amLODipine (NORVASC) 10 MG tablet Take 10 mg by mouth every morning.    [provider]  atorvastatin (LIPITOR) 80 MG tablet Take 80 mg by mouth at bedtime.    [provider]  carvedilol (COREG) 6.25 MG tablet Take 6.25 mg by mouth 2 (two) times daily with a meal.    [provider]  ezetimibe (ZETIA) 10 MG tablet Take 10 mg by mouth daily. 06/08/23   [provider]  ferrous gluconate (FERGON) 324 MG tablet Take 324 mg by mouth every other day. 06/08/23   [provider]  gabapentin (NEURONTIN) 100 MG capsule Take 100 mg by mouth at bedtime.    [provider]  GNP VITAMIN C 500 MG tablet Take 500 mg by mouth every other day. 06/08/23   [provider]  hydrALAZINE (APRESOLINE) 50 MG tablet Take 50 mg by mouth 3 (three) times daily.     [provider]  irbesartan (AVAPRO) 300 MG tablet Take 300 mg by mouth daily.    [provider]  melatonin 5 MG TABS Take 5 mg by mouth at bedtime.    [provider]  oxyCODONE (OXY IR/ROXICODONE) 5 MG immediate release tablet Take 5 mg by mouth every 6 (six) hours as needed for severe pain.    [provider]  rivaroxaban (XARELTO) 20 MG TABS tablet Start after 11/23/14. 20mg  po daily Patient taking differently: Take 20 mg by mouth every morning. Start after 11/23/14. 20mg  po daily 10/25/15   Hollice Espy, MD      Allergies    Hydrochlorothiazide    Review of Systems   Review of Systems  All other systems reviewed and are negative.   Physical Exam Updated Vital Signs BP (!) 188/94   Pulse 95   Temp 98.2 F (36.8 C)   Resp 16   Ht 5\' 3"  (1.6 m)   Wt 63.5 kg   SpO2 100%   BMI 24.80 kg/m  Physical Exam Vitals and nursing note reviewed.  Constitutional:      General: She is not in acute distress.    Appearance: She is well-developed. She is not diaphoretic.  HENT:     Head: Normocephalic and atraumatic.  Cardiovascular:     Rate and Rhythm: Normal rate and regular rhythm.     Heart sounds: No murmur  heard.    No friction rub. No gallop.  Pulmonary:     Effort: Pulmonary effort is normal. No respiratory distress.     Breath sounds: Normal breath sounds. No wheezing.  Abdominal:     General: Bowel sounds are normal. There is no distension.     Palpations: Abdomen is soft.     Tenderness: There is no abdominal tenderness.  Musculoskeletal:        General: Normal range of motion.     Cervical back: Normal range of motion and neck supple.  Skin:    General: Skin is warm and dry.  Neurological:     General: No focal deficit present.     Mental Status: She is alert and oriented to person, place, and time.     ED Results / Procedures / Treatments   Labs (all labs ordered are listed, but only abnormal results are  displayed) Labs Reviewed  RESP PANEL BY RT-PCR (RSV, FLU A&B, COVID)  RVPGX2  CBC  BASIC METABOLIC PANEL    EKG None  Radiology No results found.  Procedures Procedures  {Document cardiac monitor, telemetry assessment procedure when appropriate:1}  Medications Ordered in ED Medications  cloNIDine (CATAPRES) tablet 0.2 mg (has no administration in time range)    ED Course/ Medical Decision Making/ A&P   {   Click here for ABCD2, HEART and other calculatorsREFRESH Note before signing :1}                              Medical Decision Making Amount and/or Complexity of Data Reviewed Labs: ordered.  Risk Prescription drug management.   ***  {Document critical care time when appropriate:1} {Document review of labs and clinical decision tools ie heart score, Chads2Vasc2 etc:1}  {Document your independent review of radiology images, and any outside records:1} {Document your discussion with family members, caretakers, and with consultants:1} {Document social determinants of health affecting pt's care:1} {Document your decision making why or why not admission, treatments were needed:1} Final Clinical Impression(s) / ED Diagnoses Final diagnoses:  None    Rx / DC Orders ED Discharge Orders     None

## 2023-10-26 NOTE — ED Triage Notes (Addendum)
217/115 in triage. Went to  [facility] earlier via EMS. Hoarse voice. Congestion. Denies CP, SOB.

## 2023-10-26 NOTE — ED Triage Notes (Signed)
Pt BIB [facility] from home due to hypertension, epistaxis that has resolved, and a headache. Nose only bleeds when she blows it and she stated that her head hurts due to hypertension. Pt stated that she did take her BP medication this morning and at 1500.

## 2023-10-27 DIAGNOSIS — R519 Headache, unspecified: Secondary | ICD-10-CM | POA: Diagnosis not present

## 2023-10-27 LAB — RESP PANEL BY RT-PCR (RSV, FLU A&B, COVID)  RVPGX2
Influenza A by PCR: NEGATIVE
Influenza B by PCR: NEGATIVE
Resp Syncytial Virus by PCR: NEGATIVE
SARS Coronavirus 2 by RT PCR: NEGATIVE

## 2023-10-27 MED ORDER — AZITHROMYCIN 250 MG PO TABS
500.0000 mg | ORAL_TABLET | Freq: Once | ORAL | Status: AC
  Administered 2023-10-27: 500 mg via ORAL
  Filled 2023-10-27: qty 2

## 2023-10-27 MED ORDER — CLONIDINE HCL 0.1 MG PO TABS: 0.1000 mg | ORAL_TABLET | Freq: Two times a day (BID) | ORAL | 0 refills | Status: AC | PRN

## 2023-10-27 MED ORDER — AZITHROMYCIN 250 MG PO TABS: 250.0000 mg | ORAL_TABLET | Freq: Every day | ORAL | 0 refills | Status: AC

## 2023-10-27 NOTE — Discharge Instructions (Signed)
Begin taking Zithromax as prescribed.  If your blood pressure continues to run greater than 180 systolic at home, take a dose of the clonidine you were prescribed here this evening.  Keep a record of your blood pressures and follow-up with your primary doctor in the next 1 to 2 weeks.

## 2024-03-24 ENCOUNTER — Other Ambulatory Visit: Payer: Self-pay

## 2024-03-24 ENCOUNTER — Emergency Department (HOSPITAL_COMMUNITY)
Admission: EM | Admit: 2024-03-24 | Discharge: 2024-03-24 | Disposition: A | Discharge status: Home / Self Care | Attending: Emergency Medicine | Admitting: Emergency Medicine

## 2024-03-24 ENCOUNTER — Emergency Department (HOSPITAL_COMMUNITY)

## 2024-03-24 ENCOUNTER — Encounter (HOSPITAL_COMMUNITY): Payer: Self-pay

## 2024-03-24 DIAGNOSIS — I110 Hypertensive heart disease with heart failure: Secondary | ICD-10-CM | POA: Insufficient documentation

## 2024-03-24 DIAGNOSIS — I509 Heart failure, unspecified: Secondary | ICD-10-CM | POA: Diagnosis not present

## 2024-03-24 DIAGNOSIS — E876 Hypokalemia: Secondary | ICD-10-CM | POA: Diagnosis not present

## 2024-03-24 DIAGNOSIS — Z79899 Other long term (current) drug therapy: Secondary | ICD-10-CM | POA: Insufficient documentation

## 2024-03-24 DIAGNOSIS — Z7901 Long term (current) use of anticoagulants: Secondary | ICD-10-CM | POA: Diagnosis not present

## 2024-03-24 DIAGNOSIS — I10 Essential (primary) hypertension: Secondary | ICD-10-CM

## 2024-03-24 LAB — CBC WITH DIFFERENTIAL/PLATELET
Abs Immature Granulocytes: 0.02 10*3/uL (ref 0.00–0.07)
Basophils Absolute: 0 10*3/uL (ref 0.0–0.1)
Basophils Relative: 0 %
Eosinophils Absolute: 0.1 10*3/uL (ref 0.0–0.5)
Eosinophils Relative: 1 %
HCT: 42.4 % (ref 36.0–46.0)
Hemoglobin: 12.9 g/dL (ref 12.0–15.0)
Immature Granulocytes: 0 %
Lymphocytes Relative: 23 %
Lymphs Abs: 1.7 10*3/uL (ref 0.7–4.0)
MCH: 26.9 pg (ref 26.0–34.0)
MCHC: 30.4 g/dL (ref 30.0–36.0)
MCV: 88.3 fL (ref 80.0–100.0)
Monocytes Absolute: 0.7 10*3/uL (ref 0.1–1.0)
Monocytes Relative: 9 %
Neutro Abs: 4.9 10*3/uL (ref 1.7–7.7)
Neutrophils Relative %: 67 %
Platelets: 245 10*3/uL (ref 150–400)
RBC: 4.8 MIL/uL (ref 3.87–5.11)
RDW: 15.2 % (ref 11.5–15.5)
WBC: 7.3 10*3/uL (ref 4.0–10.5)
nRBC: 0 % (ref 0.0–0.2)

## 2024-03-24 LAB — BASIC METABOLIC PANEL WITH GFR
Anion gap: 12 (ref 5–15)
BUN: 18 mg/dL (ref 8–23)
CO2: 22 mmol/L (ref 22–32)
Calcium: 9.5 mg/dL (ref 8.9–10.3)
Chloride: 104 mmol/L (ref 98–111)
Creatinine, Ser: 0.75 mg/dL (ref 0.44–1.00)
GFR, Estimated: >60 mL/min (ref >60)
Glucose, Bld: 97 mg/dL (ref 70–99)
Potassium: 3.2 mmol/L — ABNORMAL LOW (ref 3.5–5.1)
Sodium: 138 mmol/L (ref 135–145)

## 2024-03-24 LAB — TROPONIN I (HIGH SENSITIVITY): Troponin I (High Sensitivity): 9 ng/L (ref <18)

## 2024-03-24 MED ORDER — AMLODIPINE BESYLATE 5 MG PO TABS
10.0000 mg | ORAL_TABLET | Freq: Once | ORAL | Status: AC
  Administered 2024-03-24: 10 mg via ORAL
  Filled 2024-03-24: qty 2

## 2024-03-24 MED ORDER — OXYCODONE HCL 5 MG PO TABS
5.0000 mg | ORAL_TABLET | Freq: Once | ORAL | Status: AC
  Administered 2024-03-24: 5 mg via ORAL
  Filled 2024-03-24: qty 1

## 2024-03-24 MED ORDER — CLONIDINE HCL 0.1 MG PO TABS
0.1000 mg | ORAL_TABLET | Freq: Once | ORAL | Status: AC
  Administered 2024-03-24: 0.1 mg via ORAL
  Filled 2024-03-24: qty 1

## 2024-03-24 MED ORDER — METOPROLOL TARTRATE 5 MG/5ML IV SOLN: 5.0000 mg | Freq: Once | INTRAVENOUS | Status: DC

## 2024-03-24 MED ORDER — HYDRALAZINE HCL 20 MG/ML IJ SOLN
15.0000 mg | Freq: Once | INTRAMUSCULAR | Status: AC
  Administered 2024-03-24: 15 mg via INTRAMUSCULAR
  Filled 2024-03-24: qty 1

## 2024-03-24 NOTE — Discharge Instructions (Addendum)
 Please call primary care team first thing tomorrow morning to help you with re-prescribing your medications that you have been taking incorrectly.

## 2024-03-24 NOTE — ED Provider Notes (Signed)
 [place] EMERGENCY DEPARTMENT AT [facility] Provider Note   CSN: [id number] Arrival date & time: 03/24/24  1620     Patient presents with: Hypertension   Margaret Avila is a 73 y.o. female.   Patient is a 73 year old female with past medical history of hypertension, palpitations, heart failure with preserved ejection fraction, memory problems, etc. presenting for complaints of hypertension.  Patient states that she was seen at her chemotherapy center when she was diagnosed with elevated blood pressure today.  She does admit to some lightheadedness and dizziness when taking her blood pressure medication so states she has been skipping over the last couple days.  At the bedside with the patient she has a large packet of all of her medications that have been placed in sealed compartments labeled for morning, noon, evening, and before bed.  It appears that the patient has been taking the medications in a vertical fashion which means she has been taking only the morning doses of all of her medications that are now all used.  She has been missing her noon, evening, and before bed doses for each day.  Her blood pressure on arrival was 223/112.  She denies any vision changes, headache, hearing changes, chest pain, or current dizziness.  The history is provided by the patient. No language interpreter was used.  Hypertension Pertinent negatives include no chest pain, no abdominal pain and no shortness of breath.       Prior to Admission medications   Medication Sig Start Date End Date Taking? Authorizing Provider  acetaminophen -codeine (TYLENOL  #3) 300-30 MG tablet Take 1 tablet by mouth every 6 (six) hours as needed. 06/07/23   [provider]  amLODipine  (NORVASC ) 10 MG tablet Take 10 mg by mouth every morning.    [provider]  atorvastatin  (LIPITOR ) 80 MG tablet Take 80 mg by mouth at bedtime.    [provider]  azithromycin  (ZITHROMAX ) 250 MG tablet  Take 1 tablet (250 mg total) by mouth daily. 10/27/23   Orvilla Blander, MD  carvedilol  (COREG ) 6.25 MG tablet Take 6.25 mg by mouth 2 (two) times daily with a meal.    [provider]  cloNIDine  (CATAPRES ) 0.1 MG tablet Take 1 tablet (0.1 mg total) by mouth every 12 (twelve) hours as needed. 10/27/23   Orvilla Blander, MD  ezetimibe (ZETIA) 10 MG tablet Take 10 mg by mouth daily. 06/08/23   [provider]  ferrous gluconate (FERGON) 324 MG tablet Take 324 mg by mouth every other day. 06/08/23   [provider]  gabapentin  (NEURONTIN ) 100 MG capsule Take 100 mg by mouth at bedtime.    [provider]  GNP VITAMIN C 500 MG tablet Take 500 mg by mouth every other day. 06/08/23   [provider]  hydrALAZINE  (APRESOLINE ) 50 MG tablet Take 50 mg by mouth 3 (three) times daily.    [provider]  irbesartan (AVAPRO) 300 MG tablet Take 300 mg by mouth daily.    [provider]  melatonin 5 MG TABS Take 5 mg by mouth at bedtime.    [provider]  oxyCODONE  (OXY IR/ROXICODONE ) 5 MG immediate release tablet Take 5 mg by mouth every 6 (six) hours as needed for severe pain.    [provider]  rivaroxaban  (XARELTO ) 20 MG TABS tablet Start after 11/23/14. 20mg  po daily Patient taking differently: Take 20 mg by mouth every morning. Start after 11/23/14. 20mg  po daily 10/25/15   Krishnan, Sendil  K, MD    Allergies: Hydrochlorothiazide     Review of Systems  Constitutional:  Negative for chills and fever.  HENT:  Negative for ear pain and sore throat.   Eyes:  Negative for pain and visual disturbance.  Respiratory:  Negative for cough and shortness of breath.   Cardiovascular:  Negative for chest pain and palpitations.  Gastrointestinal:  Negative for abdominal pain and vomiting.  Genitourinary:  Negative for dysuria and hematuria.  Musculoskeletal:  Negative for arthralgias and back pain.  Skin:  Negative for color change and rash.   Neurological:  Negative for seizures and syncope.  All other systems reviewed and are negative.   Updated Vital Signs BP (!) 157/95   Pulse 98   Temp 98.2 F (36.8 C) (Oral)   Resp (!) 22   Ht 5' 3 (1.6 m)   Wt 63.5 kg   SpO2 97%   BMI 24.80 kg/m   Physical Exam Vitals and nursing note reviewed.  Constitutional:      General: She is not in acute distress.    Appearance: She is well-developed.  HENT:     Head: Normocephalic and atraumatic.   Eyes:     Conjunctiva/sclera: Conjunctivae normal.    Cardiovascular:     Rate and Rhythm: Normal rate and regular rhythm.     Heart sounds: No murmur heard. Pulmonary:     Effort: Pulmonary effort is normal. No respiratory distress.     Breath sounds: Normal breath sounds.  Abdominal:     Palpations: Abdomen is soft.     Tenderness: There is no abdominal tenderness.   Musculoskeletal:        General: No swelling.     Cervical back: Neck supple.   Skin:    General: Skin is warm and dry.     Capillary Refill: Capillary refill takes less than 2 seconds.   Neurological:     Mental Status: She is alert.   Psychiatric:        Mood and Affect: Mood normal.     (all labs ordered are listed, but only abnormal results are displayed) Labs Reviewed  BASIC METABOLIC PANEL WITH GFR - Abnormal; Notable for the following components:      Result Value   Potassium 3.2 (*)    All other components within normal limits  CBC WITH DIFFERENTIAL/PLATELET  TROPONIN I (HIGH SENSITIVITY)  TROPONIN I (HIGH SENSITIVITY)    EKG: EKG Interpretation Date/Time:  Monday March 24 2024 20:42:29 EDT Ventricular Rate:  86 PR Interval:  145 QRS Duration:  104 QT Interval:  374 QTC Calculation: 448 R Axis:   35  Text Interpretation: Sinus rhythm Left ventricular hypertrophy Abnormal inferior Q waves Confirmed by Owen Blowers ([id number]) on 03/24/2024 9:10:36 PM  Radiology: DG Chest Portable 1 View Result Date: 03/24/2024 CLINICAL DATA:   Hypertension. EXAM: PORTABLE CHEST 1 VIEW COMPARISON:  Three 3 tree 23. FINDINGS: The heart size and mediastinal contours are within normal limits. There is atherosclerotic calcification of the aorta. No consolidation, effusion, or pneumothorax is seen. A right chest port appears stable in position. The bony structures are stable. IMPRESSION: No active disease. Electronically Signed   By: Wyvonnia Heimlich M.D.   On: 03/24/2024 20:55     Procedures   Medications Ordered in the ED  cloNIDine  (CATAPRES ) tablet 0.1 mg (0.1 mg Oral Given 03/24/24 2145)  amLODipine  (NORVASC ) tablet 10 mg (10 mg Oral Given 03/24/24 2145)  oxyCODONE  (Oxy IR/ROXICODONE ) immediate release tablet 5 mg (  5 mg Oral Given 03/24/24 2145)  hydrALAZINE  (APRESOLINE ) injection 15 mg (15 mg Intramuscular Given 03/24/24 2142)                                    Medical Decision Making Amount and/or Complexity of Data Reviewed Radiology: ordered.  Risk Prescription drug management.   73 year old female with past medical history of hypertension, palpitations, heart failure with preserved ejection fraction, memory problems, etc. presenting for complaints of hypertension.  On exam patient is alert and x 3, no acute distress, hypertensive at 223/112 with otherwise stable vital signs.  No complaints of chest pain, shortness of breath, difficulty breathing, vision changes, or headache.  No current lightheadedness or dizziness.  She does complain of a history of lightheadedness and dizziness that occur when taking her blood sugar medications which is why she has been noncompliant.  Her twelve-lead EKG demonstrates sinus rhythm with a stable rate.  No ST segment changes.  Troponin within normal limits.  Creatinine.  Patient diagnosed with poorly controlled hypertension without endorgan failure.  Medication given in ED and blood pressure controlled.  Patient recommended for close follow-up with her PCP for further management and help with fixing  her medications that she has been taking incorrectly.  Patient in no distress and overall condition improved here in the ED. Detailed discussions were had with the patient regarding current findings, and need for close f/u with PCP or on call doctor. The patient has been instructed to return immediately if the symptoms worsen in any way for re-evaluation. Patient verbalized understanding and is in agreement with current care plan. All questions answered prior to discharge.      Final diagnoses:  Poorly-controlled hypertension    ED Discharge Orders     None          Quinn Bucco, DO 03/24/24 2218

## 2024-03-24 NOTE — ED Triage Notes (Signed)
 Pt c/o headache and hypertensive but does not take her medications. Police were at her house earlier and she as upset stating she is depressed.

## 2024-03-24 NOTE — ED Provider Triage Note (Signed)
 Emergency Medicine Provider Triage Evaluation Note  JAIDEE STIPE , a 73 y.o. female  was evaluated in triage.  Pt complains of evaded blood pressure.  Patient states she is on oral chemotherapy for breast cancer, followed up with her oncologist today and blood pressure was elevated in the office.  She went home and got upset and decided to come in the ER for evaluation of her high blood pressure.  She denies chest pain, shortness of breath, nausea or vomiting.  No headache, no numbness ting or weakness, no dizziness.  She states she is supposed take blood pressure medication but it makes her feel lightheaded, her PCP is referring her to a specialist for management of her blood pressure..  Review of Systems  Positive: As above Negative: Above  Physical Exam  BP (!) 235/115 (BP Location: Right Arm)   Pulse 94   Temp 98.2 F (36.8 C) (Oral)   Resp 16   Ht 5' 3 (1.6 m)   Wt 63.5 kg   SpO2 100%   BMI 24.80 kg/m  Gen:   Awake, no distress   Resp:  Normal effort  MSK:   Moves extremities without difficulty  Other:    Medical Decision Making  Medically screening exam initiated at 6:17 PM.  Appropriate orders placed.  ELFA WOOTON was informed that the remainder of the evaluation will be completed by another provider, this initial triage assessment does not replace that evaluation, and the importance of remaining in the ED until their evaluation is complete.     Aimee Houseman, [place] 03/24/24 1818

## 2024-04-14 ENCOUNTER — Other Ambulatory Visit (INDEPENDENT_AMBULATORY_CARE_PROVIDER_SITE_OTHER): Payer: Self-pay | Admitting: Nurse Practitioner

## 2024-04-14 DIAGNOSIS — I10 Essential (primary) hypertension: Secondary | ICD-10-CM

## 2024-04-14 DIAGNOSIS — I160 Hypertensive urgency: Secondary | ICD-10-CM

## 2024-04-16 ENCOUNTER — Encounter (INDEPENDENT_AMBULATORY_CARE_PROVIDER_SITE_OTHER): Admitting: Nurse Practitioner

## 2024-04-16 ENCOUNTER — Encounter (INDEPENDENT_AMBULATORY_CARE_PROVIDER_SITE_OTHER)
